# Patient Record
Sex: Female | Born: 1935
Health system: Southern US, Community
[De-identification: ages and names within clinical notes are randomized; demographics above are authoritative.]

## PROBLEM LIST (undated history)

## (undated) DIAGNOSIS — R399 Unspecified symptoms and signs involving the genitourinary system: Secondary | ICD-10-CM

## (undated) DIAGNOSIS — E890 Postprocedural hypothyroidism: Secondary | ICD-10-CM

## (undated) DIAGNOSIS — K219 Gastro-esophageal reflux disease without esophagitis: Secondary | ICD-10-CM

## (undated) DIAGNOSIS — Z859 Personal history of malignant neoplasm, unspecified: Secondary | ICD-10-CM

## (undated) DIAGNOSIS — R112 Nausea with vomiting, unspecified: Secondary | ICD-10-CM

## (undated) DIAGNOSIS — N362 Urethral caruncle: Secondary | ICD-10-CM

## (undated) DIAGNOSIS — Z9889 Other specified postprocedural states: Secondary | ICD-10-CM

## (undated) DIAGNOSIS — N200 Calculus of kidney: Secondary | ICD-10-CM

## (undated) DIAGNOSIS — N329 Bladder disorder, unspecified: Secondary | ICD-10-CM

## (undated) DIAGNOSIS — N201 Calculus of ureter: Secondary | ICD-10-CM

## (undated) DIAGNOSIS — I10 Essential (primary) hypertension: Secondary | ICD-10-CM

## (undated) DIAGNOSIS — K5790 Diverticulosis of intestine, part unspecified, without perforation or abscess without bleeding: Secondary | ICD-10-CM

## (undated) DIAGNOSIS — J302 Other seasonal allergic rhinitis: Secondary | ICD-10-CM

## (undated) DIAGNOSIS — M1990 Unspecified osteoarthritis, unspecified site: Secondary | ICD-10-CM

## (undated) DIAGNOSIS — G43909 Migraine, unspecified, not intractable, without status migrainosus: Secondary | ICD-10-CM

## (undated) DIAGNOSIS — N393 Stress incontinence (female) (male): Secondary | ICD-10-CM

## (undated) DIAGNOSIS — F419 Anxiety disorder, unspecified: Secondary | ICD-10-CM

## (undated) DIAGNOSIS — T8859XA Other complications of anesthesia, initial encounter: Secondary | ICD-10-CM

## (undated) DIAGNOSIS — T4145XA Adverse effect of unspecified anesthetic, initial encounter: Secondary | ICD-10-CM

## (undated) DIAGNOSIS — K449 Diaphragmatic hernia without obstruction or gangrene: Secondary | ICD-10-CM

## (undated) DIAGNOSIS — F32A Depression, unspecified: Secondary | ICD-10-CM

## (undated) DIAGNOSIS — R310 Gross hematuria: Secondary | ICD-10-CM

## (undated) DIAGNOSIS — Z972 Presence of dental prosthetic device (complete) (partial): Secondary | ICD-10-CM

## (undated) DIAGNOSIS — F329 Major depressive disorder, single episode, unspecified: Secondary | ICD-10-CM

## (undated) DIAGNOSIS — Z87442 Personal history of urinary calculi: Secondary | ICD-10-CM

## (undated) HISTORY — DX: Depression, unspecified: F32.A

## (undated) HISTORY — PX: CATARACT EXTRACTION W/ INTRAOCULAR LENS  IMPLANT, BILATERAL: SHX1307

## (undated) HISTORY — DX: Diverticulosis of intestine, part unspecified, without perforation or abscess without bleeding: K57.90

## (undated) HISTORY — DX: Major depressive disorder, single episode, unspecified: F32.9

## (undated) HISTORY — DX: Diaphragmatic hernia without obstruction or gangrene: K44.9

## (undated) HISTORY — PX: EXTRACORPOREAL SHOCK WAVE LITHOTRIPSY: SHX1557

## (undated) HISTORY — PX: ANTERIOR AND POSTERIOR REPAIR: SHX1172

## (undated) HISTORY — DX: Anxiety disorder, unspecified: F41.9

## (undated) HISTORY — DX: Essential (primary) hypertension: I10

## (undated) HISTORY — DX: Migraine, unspecified, not intractable, without status migrainosus: G43.909

---

## 1957-10-18 HISTORY — PX: THYROIDECTOMY: SHX17

## 1957-10-18 HISTORY — PX: ABDOMINAL EXPLORATION SURGERY: SHX538

## 1976-10-18 HISTORY — PX: VAGINAL HYSTERECTOMY: SUR661

## 1999-03-23 ENCOUNTER — Encounter (INDEPENDENT_AMBULATORY_CARE_PROVIDER_SITE_OTHER): Payer: Self-pay | Admitting: *Deleted

## 2000-06-07 ENCOUNTER — Other Ambulatory Visit: Admission: RE | Admit: 2000-06-07 | Discharge: 2000-06-07 | Payer: Self-pay | Admitting: Family Medicine

## 2001-07-18 ENCOUNTER — Other Ambulatory Visit: Admission: RE | Admit: 2001-07-18 | Discharge: 2001-07-18 | Payer: Self-pay | Admitting: Family Medicine

## 2001-08-31 ENCOUNTER — Encounter (INDEPENDENT_AMBULATORY_CARE_PROVIDER_SITE_OTHER): Payer: Self-pay | Admitting: *Deleted

## 2001-08-31 ENCOUNTER — Ambulatory Visit (HOSPITAL_COMMUNITY): Admission: RE | Admit: 2001-08-31 | Discharge: 2001-08-31 | Payer: Self-pay | Admitting: Obstetrics and Gynecology

## 2001-08-31 HISTORY — PX: OTHER SURGICAL HISTORY: SHX169

## 2002-06-29 ENCOUNTER — Ambulatory Visit (HOSPITAL_COMMUNITY): Admission: RE | Admit: 2002-06-29 | Discharge: 2002-06-29 | Payer: Self-pay | Admitting: Family Medicine

## 2002-06-29 ENCOUNTER — Encounter: Payer: Self-pay | Admitting: Family Medicine

## 2002-08-21 ENCOUNTER — Inpatient Hospital Stay (HOSPITAL_COMMUNITY): Admission: RE | Admit: 2002-08-21 | Discharge: 2002-08-24 | Payer: Self-pay | Admitting: Obstetrics and Gynecology

## 2002-08-21 ENCOUNTER — Encounter (INDEPENDENT_AMBULATORY_CARE_PROVIDER_SITE_OTHER): Payer: Self-pay

## 2002-08-21 HISTORY — PX: OTHER SURGICAL HISTORY: SHX169

## 2002-12-20 ENCOUNTER — Encounter: Payer: Self-pay | Admitting: Urology

## 2002-12-20 ENCOUNTER — Ambulatory Visit (HOSPITAL_BASED_OUTPATIENT_CLINIC_OR_DEPARTMENT_OTHER): Admission: RE | Admit: 2002-12-20 | Discharge: 2002-12-20 | Payer: Self-pay | Admitting: Urology

## 2003-04-25 ENCOUNTER — Ambulatory Visit (HOSPITAL_BASED_OUTPATIENT_CLINIC_OR_DEPARTMENT_OTHER): Admission: RE | Admit: 2003-04-25 | Discharge: 2003-04-25 | Payer: Self-pay | Admitting: Urology

## 2003-06-03 ENCOUNTER — Encounter (INDEPENDENT_AMBULATORY_CARE_PROVIDER_SITE_OTHER): Payer: Self-pay | Admitting: Specialist

## 2003-06-03 ENCOUNTER — Inpatient Hospital Stay (HOSPITAL_COMMUNITY): Admission: RE | Admit: 2003-06-03 | Discharge: 2003-06-05 | Payer: Self-pay | Admitting: Obstetrics and Gynecology

## 2003-09-30 ENCOUNTER — Emergency Department (HOSPITAL_COMMUNITY): Admission: EM | Admit: 2003-09-30 | Discharge: 2003-09-30 | Payer: Self-pay | Admitting: Emergency Medicine

## 2003-10-17 ENCOUNTER — Ambulatory Visit (HOSPITAL_COMMUNITY): Admission: RE | Admit: 2003-10-17 | Discharge: 2003-10-17 | Payer: Self-pay | Admitting: Urology

## 2004-02-05 ENCOUNTER — Other Ambulatory Visit: Admission: RE | Admit: 2004-02-05 | Discharge: 2004-02-05 | Payer: Self-pay | Admitting: Obstetrics and Gynecology

## 2004-02-05 ENCOUNTER — Encounter (INDEPENDENT_AMBULATORY_CARE_PROVIDER_SITE_OTHER): Payer: Self-pay | Admitting: *Deleted

## 2004-06-29 ENCOUNTER — Ambulatory Visit (HOSPITAL_COMMUNITY): Admission: RE | Admit: 2004-06-29 | Discharge: 2004-06-29 | Payer: Self-pay | Admitting: Urology

## 2004-09-07 ENCOUNTER — Ambulatory Visit (HOSPITAL_COMMUNITY): Admission: RE | Admit: 2004-09-07 | Discharge: 2004-09-07 | Payer: Self-pay | Admitting: Urology

## 2005-12-09 LAB — CONVERTED CEMR LAB: Pap Smear: NORMAL

## 2006-03-07 ENCOUNTER — Encounter (INDEPENDENT_AMBULATORY_CARE_PROVIDER_SITE_OTHER): Payer: Self-pay | Admitting: *Deleted

## 2006-03-07 ENCOUNTER — Encounter: Admission: RE | Admit: 2006-03-07 | Discharge: 2006-03-07 | Payer: Self-pay | Admitting: Family Medicine

## 2006-03-08 ENCOUNTER — Other Ambulatory Visit: Admission: RE | Admit: 2006-03-08 | Discharge: 2006-03-08 | Payer: Self-pay | Admitting: Obstetrics and Gynecology

## 2006-05-02 ENCOUNTER — Encounter (INDEPENDENT_AMBULATORY_CARE_PROVIDER_SITE_OTHER): Payer: Self-pay | Admitting: *Deleted

## 2006-06-14 ENCOUNTER — Ambulatory Visit (HOSPITAL_BASED_OUTPATIENT_CLINIC_OR_DEPARTMENT_OTHER): Admission: RE | Admit: 2006-06-14 | Discharge: 2006-06-15 | Payer: Self-pay | Admitting: Orthopedic Surgery

## 2006-06-14 HISTORY — PX: SHOULDER ARTHROSCOPY WITH OPEN ROTATOR CUFF REPAIR AND DISTAL CLAVICLE ACROMINECTOMY: SHX5683

## 2006-07-26 ENCOUNTER — Encounter (INDEPENDENT_AMBULATORY_CARE_PROVIDER_SITE_OTHER): Payer: Self-pay | Admitting: *Deleted

## 2006-08-15 ENCOUNTER — Ambulatory Visit: Payer: Self-pay | Admitting: Gastroenterology

## 2006-08-30 ENCOUNTER — Ambulatory Visit: Payer: Self-pay | Admitting: Gastroenterology

## 2006-08-30 LAB — HM COLONOSCOPY: HM COLON: NORMAL

## 2007-04-11 ENCOUNTER — Encounter (INDEPENDENT_AMBULATORY_CARE_PROVIDER_SITE_OTHER): Payer: Self-pay | Admitting: *Deleted

## 2007-05-11 ENCOUNTER — Encounter (INDEPENDENT_AMBULATORY_CARE_PROVIDER_SITE_OTHER): Payer: Self-pay | Admitting: *Deleted

## 2007-11-03 ENCOUNTER — Encounter (INDEPENDENT_AMBULATORY_CARE_PROVIDER_SITE_OTHER): Payer: Self-pay | Admitting: *Deleted

## 2008-02-29 ENCOUNTER — Ambulatory Visit (HOSPITAL_COMMUNITY): Admission: RE | Admit: 2008-02-29 | Discharge: 2008-02-29 | Payer: Self-pay | Admitting: Urology

## 2008-07-08 ENCOUNTER — Encounter (INDEPENDENT_AMBULATORY_CARE_PROVIDER_SITE_OTHER): Payer: Self-pay | Admitting: *Deleted

## 2008-10-07 ENCOUNTER — Encounter (INDEPENDENT_AMBULATORY_CARE_PROVIDER_SITE_OTHER): Payer: Self-pay | Admitting: *Deleted

## 2008-12-11 ENCOUNTER — Ambulatory Visit: Payer: Self-pay | Admitting: *Deleted

## 2008-12-11 DIAGNOSIS — I10 Essential (primary) hypertension: Secondary | ICD-10-CM | POA: Insufficient documentation

## 2008-12-11 DIAGNOSIS — G43909 Migraine, unspecified, not intractable, without status migrainosus: Secondary | ICD-10-CM | POA: Insufficient documentation

## 2008-12-11 DIAGNOSIS — K5909 Other constipation: Secondary | ICD-10-CM

## 2008-12-11 DIAGNOSIS — N200 Calculus of kidney: Secondary | ICD-10-CM

## 2008-12-11 DIAGNOSIS — E039 Hypothyroidism, unspecified: Secondary | ICD-10-CM

## 2008-12-11 DIAGNOSIS — J301 Allergic rhinitis due to pollen: Secondary | ICD-10-CM

## 2008-12-15 DIAGNOSIS — F341 Dysthymic disorder: Secondary | ICD-10-CM | POA: Insufficient documentation

## 2008-12-15 DIAGNOSIS — K219 Gastro-esophageal reflux disease without esophagitis: Secondary | ICD-10-CM | POA: Insufficient documentation

## 2008-12-19 DIAGNOSIS — M7710 Lateral epicondylitis, unspecified elbow: Secondary | ICD-10-CM | POA: Insufficient documentation

## 2009-01-22 ENCOUNTER — Ambulatory Visit: Payer: Self-pay | Admitting: *Deleted

## 2009-03-18 ENCOUNTER — Ambulatory Visit: Payer: Self-pay | Admitting: Diagnostic Radiology

## 2009-03-18 ENCOUNTER — Ambulatory Visit: Payer: Self-pay | Admitting: Family Medicine

## 2009-03-18 ENCOUNTER — Ambulatory Visit (HOSPITAL_BASED_OUTPATIENT_CLINIC_OR_DEPARTMENT_OTHER): Admission: RE | Admit: 2009-03-18 | Discharge: 2009-03-18 | Payer: Self-pay | Admitting: Family Medicine

## 2009-07-14 ENCOUNTER — Encounter: Payer: Self-pay | Admitting: Internal Medicine

## 2009-07-16 ENCOUNTER — Encounter: Payer: Self-pay | Admitting: Internal Medicine

## 2009-07-28 ENCOUNTER — Ambulatory Visit: Payer: Self-pay | Admitting: Internal Medicine

## 2009-07-28 LAB — CONVERTED CEMR LAB
AST: 20 units/L (ref 0–37)
Albumin: 4.8 g/dL (ref 3.5–5.2)
Alkaline Phosphatase: 70 units/L (ref 39–117)
CO2: 27 meq/L (ref 19–32)
Glucose, Bld: 89 mg/dL (ref 70–99)
Hemoglobin: 13.3 g/dL (ref 12.0–15.0)
Lymphocytes Relative: 28 % (ref 12–46)
Monocytes Absolute: 0.6 10*3/uL (ref 0.1–1.0)
Monocytes Relative: 8 % (ref 3–12)
Neutro Abs: 4.8 10*3/uL (ref 1.7–7.7)
Neutrophils Relative %: 60 % (ref 43–77)
Potassium: 4.2 meq/L (ref 3.5–5.3)
RBC: 4.29 M/uL (ref 3.87–5.11)
Sodium: 143 meq/L (ref 135–145)
Total Bilirubin: 0.4 mg/dL (ref 0.3–1.2)
WBC: 7.9 10*3/uL (ref 4.0–10.5)

## 2009-07-29 ENCOUNTER — Encounter: Payer: Self-pay | Admitting: Internal Medicine

## 2009-07-31 ENCOUNTER — Other Ambulatory Visit: Admission: RE | Admit: 2009-07-31 | Discharge: 2009-07-31 | Payer: Self-pay | Admitting: Obstetrics and Gynecology

## 2009-07-31 ENCOUNTER — Encounter: Payer: Self-pay | Admitting: Obstetrics and Gynecology

## 2009-07-31 ENCOUNTER — Ambulatory Visit: Payer: Self-pay | Admitting: Obstetrics and Gynecology

## 2009-08-25 ENCOUNTER — Ambulatory Visit: Payer: Self-pay | Admitting: Internal Medicine

## 2009-08-25 LAB — CONVERTED CEMR LAB
BUN: 23 mg/dL (ref 6–23)
Chloride: 104 meq/L (ref 96–112)
Creatinine, Ser: 0.83 mg/dL (ref 0.40–1.20)
Glucose, Bld: 87 mg/dL (ref 70–99)
Potassium: 4.2 meq/L (ref 3.5–5.3)

## 2009-09-01 ENCOUNTER — Ambulatory Visit: Payer: Self-pay | Admitting: Internal Medicine

## 2009-10-01 ENCOUNTER — Telehealth: Payer: Self-pay | Admitting: Internal Medicine

## 2009-10-03 ENCOUNTER — Telehealth: Payer: Self-pay | Admitting: Internal Medicine

## 2009-11-25 ENCOUNTER — Ambulatory Visit (HOSPITAL_BASED_OUTPATIENT_CLINIC_OR_DEPARTMENT_OTHER): Admission: RE | Admit: 2009-11-25 | Discharge: 2009-11-25 | Payer: Self-pay | Admitting: Urology

## 2009-11-25 HISTORY — PX: OTHER SURGICAL HISTORY: SHX169

## 2009-12-15 ENCOUNTER — Ambulatory Visit: Payer: Self-pay | Admitting: Obstetrics and Gynecology

## 2009-12-22 ENCOUNTER — Ambulatory Visit: Payer: Self-pay | Admitting: Obstetrics and Gynecology

## 2010-01-01 ENCOUNTER — Ambulatory Visit: Payer: Self-pay | Admitting: Internal Medicine

## 2010-01-08 ENCOUNTER — Telehealth: Payer: Self-pay | Admitting: Internal Medicine

## 2010-01-08 ENCOUNTER — Ambulatory Visit: Payer: Self-pay | Admitting: Internal Medicine

## 2010-01-08 DIAGNOSIS — R1310 Dysphagia, unspecified: Secondary | ICD-10-CM | POA: Insufficient documentation

## 2010-01-08 LAB — CONVERTED CEMR LAB
ALT: 21 units/L (ref 0–35)
AST: 23 units/L (ref 0–37)
BUN: 23 mg/dL (ref 6–23)
CO2: 25 meq/L (ref 19–32)
Calcium: 9.5 mg/dL (ref 8.4–10.5)
Cholesterol: 223 mg/dL — ABNORMAL HIGH (ref 0–200)
Creatinine, Ser: 0.81 mg/dL (ref 0.40–1.20)
Glucose, Bld: 110 mg/dL — ABNORMAL HIGH (ref 70–99)
Sodium: 142 meq/L (ref 135–145)
TSH: 0.689 microintl units/mL (ref 0.350–4.500)
Total CHOL/HDL Ratio: 3.1

## 2010-01-09 ENCOUNTER — Encounter: Payer: Self-pay | Admitting: Internal Medicine

## 2010-01-12 ENCOUNTER — Encounter: Admission: RE | Admit: 2010-01-12 | Discharge: 2010-01-12 | Payer: Self-pay | Admitting: Internal Medicine

## 2010-01-12 ENCOUNTER — Telehealth: Payer: Self-pay | Admitting: Internal Medicine

## 2010-07-16 ENCOUNTER — Ambulatory Visit: Payer: Self-pay | Admitting: Internal Medicine

## 2010-07-16 DIAGNOSIS — R7309 Other abnormal glucose: Secondary | ICD-10-CM

## 2010-07-16 LAB — CONVERTED CEMR LAB
Chloride: 103 meq/L (ref 96–112)
Hgb A1c MFr Bld: 5.9 % — ABNORMAL HIGH (ref <5.7)
Potassium: 4.5 meq/L (ref 3.5–5.3)
Sodium: 141 meq/L (ref 135–145)
TSH: 1.098 microintl units/mL (ref 0.350–4.500)
Vit D, 1,25-Dihydroxy: 63 (ref 30–89)

## 2010-07-17 ENCOUNTER — Encounter: Payer: Self-pay | Admitting: Internal Medicine

## 2010-07-28 ENCOUNTER — Telehealth: Payer: Self-pay | Admitting: Internal Medicine

## 2010-07-29 ENCOUNTER — Ambulatory Visit: Payer: Self-pay | Admitting: Internal Medicine

## 2010-08-04 ENCOUNTER — Encounter (INDEPENDENT_AMBULATORY_CARE_PROVIDER_SITE_OTHER): Payer: Self-pay | Admitting: *Deleted

## 2010-08-04 ENCOUNTER — Telehealth: Payer: Self-pay | Admitting: Internal Medicine

## 2010-08-19 ENCOUNTER — Ambulatory Visit: Payer: Self-pay | Admitting: Obstetrics and Gynecology

## 2010-08-19 ENCOUNTER — Other Ambulatory Visit: Admission: RE | Admit: 2010-08-19 | Discharge: 2010-08-19 | Payer: Self-pay | Admitting: Obstetrics and Gynecology

## 2010-08-20 ENCOUNTER — Encounter: Payer: Self-pay | Admitting: Internal Medicine

## 2010-08-20 LAB — HM MAMMOGRAPHY: HM Mammogram: ABNORMAL

## 2010-08-24 ENCOUNTER — Ambulatory Visit: Payer: Self-pay | Admitting: Family

## 2010-08-24 DIAGNOSIS — J019 Acute sinusitis, unspecified: Secondary | ICD-10-CM

## 2010-08-24 LAB — CONVERTED CEMR LAB: Rapid Strep: NEGATIVE

## 2010-08-25 ENCOUNTER — Encounter: Payer: Self-pay | Admitting: Internal Medicine

## 2010-08-25 ENCOUNTER — Ambulatory Visit: Payer: Self-pay | Admitting: Obstetrics and Gynecology

## 2010-09-15 ENCOUNTER — Ambulatory Visit: Payer: Self-pay | Admitting: Gastroenterology

## 2010-09-15 DIAGNOSIS — R195 Other fecal abnormalities: Secondary | ICD-10-CM

## 2010-10-07 ENCOUNTER — Ambulatory Visit: Payer: Self-pay | Admitting: Family

## 2010-10-07 ENCOUNTER — Telehealth: Payer: Self-pay | Admitting: Internal Medicine

## 2010-10-21 ENCOUNTER — Telehealth: Payer: Self-pay | Admitting: Gastroenterology

## 2010-10-23 ENCOUNTER — Encounter: Payer: Self-pay | Admitting: Gastroenterology

## 2010-10-23 ENCOUNTER — Ambulatory Visit: Admit: 2010-10-23 | Payer: Self-pay | Admitting: Gastroenterology

## 2010-10-23 ENCOUNTER — Other Ambulatory Visit: Payer: Self-pay | Admitting: Gastroenterology

## 2010-10-23 ENCOUNTER — Ambulatory Visit
Admission: RE | Admit: 2010-10-23 | Discharge: 2010-10-23 | Payer: Self-pay | Source: Home / Self Care | Attending: Gastroenterology | Admitting: Gastroenterology

## 2010-10-23 ENCOUNTER — Encounter (INDEPENDENT_AMBULATORY_CARE_PROVIDER_SITE_OTHER): Payer: Self-pay

## 2010-10-23 LAB — CBC WITH DIFFERENTIAL/PLATELET
Basophils Absolute: 0 10*3/uL (ref 0.0–0.1)
Basophils Relative: 0.3 % (ref 0.0–3.0)
Eosinophils Absolute: 0.1 10*3/uL (ref 0.0–0.7)
Eosinophils Relative: 1.8 % (ref 0.0–5.0)
HCT: 37.7 % (ref 36.0–46.0)
Hemoglobin: 13.1 g/dL (ref 12.0–15.0)
Lymphocytes Relative: 26.3 % (ref 12.0–46.0)
Lymphs Abs: 1.8 10*3/uL (ref 0.7–4.0)
MCHC: 34.6 g/dL (ref 30.0–36.0)
MCV: 93.3 fl (ref 78.0–100.0)
Monocytes Absolute: 0.5 10*3/uL (ref 0.1–1.0)
Monocytes Relative: 7.4 % (ref 3.0–12.0)
Neutro Abs: 4.5 10*3/uL (ref 1.4–7.7)
Neutrophils Relative %: 64.2 % (ref 43.0–77.0)
Platelets: 272 10*3/uL (ref 150.0–400.0)
RBC: 4.04 Mil/uL (ref 3.87–5.11)
RDW: 12.9 % (ref 11.5–14.6)
WBC: 6.9 10*3/uL (ref 4.5–10.5)

## 2010-10-26 ENCOUNTER — Ambulatory Visit
Admission: RE | Admit: 2010-10-26 | Discharge: 2010-10-26 | Payer: Self-pay | Source: Home / Self Care | Attending: Internal Medicine | Admitting: Internal Medicine

## 2010-10-26 ENCOUNTER — Encounter: Payer: Self-pay | Admitting: Gastroenterology

## 2010-10-26 DIAGNOSIS — R197 Diarrhea, unspecified: Secondary | ICD-10-CM | POA: Insufficient documentation

## 2010-10-27 ENCOUNTER — Encounter: Payer: Self-pay | Admitting: Internal Medicine

## 2010-10-27 LAB — CONVERTED CEMR LAB
BUN: 18 mg/dL (ref 6–23)
Calcium: 10.2 mg/dL (ref 8.4–10.5)
Creatinine, Ser: 0.86 mg/dL (ref 0.40–1.20)
Glucose, Bld: 84 mg/dL (ref 70–99)
Sodium: 142 meq/L (ref 135–145)

## 2010-10-30 ENCOUNTER — Telehealth: Payer: Self-pay | Admitting: Gastroenterology

## 2010-10-30 ENCOUNTER — Telehealth: Payer: Self-pay | Admitting: Internal Medicine

## 2010-11-02 ENCOUNTER — Ambulatory Visit
Admission: RE | Admit: 2010-11-02 | Discharge: 2010-11-02 | Payer: Self-pay | Source: Home / Self Care | Attending: Internal Medicine | Admitting: Internal Medicine

## 2010-11-02 ENCOUNTER — Telehealth: Payer: Self-pay | Admitting: Internal Medicine

## 2010-11-02 ENCOUNTER — Ambulatory Visit (HOSPITAL_BASED_OUTPATIENT_CLINIC_OR_DEPARTMENT_OTHER)
Admission: RE | Admit: 2010-11-02 | Discharge: 2010-11-02 | Payer: Self-pay | Source: Home / Self Care | Attending: Internal Medicine | Admitting: Internal Medicine

## 2010-11-02 DIAGNOSIS — R1032 Left lower quadrant pain: Secondary | ICD-10-CM | POA: Insufficient documentation

## 2010-11-06 ENCOUNTER — Ambulatory Visit: Admit: 2010-11-06 | Payer: Self-pay | Admitting: Gastroenterology

## 2010-11-09 ENCOUNTER — Ambulatory Visit: Admit: 2010-11-09 | Payer: Self-pay | Admitting: Internal Medicine

## 2010-11-19 NOTE — Miscellaneous (Signed)
  Clinical Lists Changes  Medications: Added new medication of DEXILANT 60 MG CPDR (DEXLANSOPRAZOLE) Take one per day - Signed Rx of DEXILANT 60 MG CPDR (DEXLANSOPRAZOLE) Take one per day;  #30 x 0;  Signed;  Entered by: Clide Cliff RN;  Authorized by: Mardella Layman MD [facility];  Method used: Electronically to [facility] [address] [place], [place], [place]  [address], Ph: [phone], Fax: [phone]    Prescriptions: DEXILANT 60 MG CPDR (DEXLANSOPRAZOLE) Take one per day  #30 x 0   Entered by:   Clide Cliff RN   Authorized by:   Mardella Layman MD [facility]   Signed by:   Clide Cliff RN on 10/23/2010   Method used:   Electronically to        [facility] (retail)       [address]       [place], [place]  [address]       Ph: [phone]       Fax: [phone]   RxID:   [id number]

## 2010-11-19 NOTE — Letter (Signed)
   [place] at [facility] [address] [place], [place]  [address]  [place] Phone: [phone]      January 09, 2010   Mallory Hardy [address] [place], [place] [address]  RE:  LAB RESULTS  Dear  [name],  The following is an interpretation of your most recent lab tests.  Please take note of any instructions provided or changes to medications that have resulted from your lab work.  ELECTROLYTES:  Good - no changes needed  KIDNEY FUNCTION TESTS:  Good - no changes needed  LIPID PANEL:  Fair - review at your next visit Triglyceride: 73   Cholesterol: 223   LDL: 135   HDL: 73   Chol/HDL%:  3.1 Ratio  THYROID STUDIES:  Thyroid studies normal TSH: 0.689     Your blood sugar is slightly elevated.  Please avoids sweets and limit your carbohydrate intake to 30 grams per meal.        Sincerely Yours,    Dr. Thomos Lemons

## 2010-11-19 NOTE — Progress Notes (Signed)
Summary: babysitting for child with scabies   Phone Note Call from Patient Call back at Home Phone [phone]   Caller: Patient Call For: yoo  Summary of Call: patient is baby sitting for a child who was diagnosed with scabies.  The pediatrician told her to call her doctor and get started on some meds.  Initial call taken by: Roselle Locus,  October 07, 2010 3:08 PM  Follow-up for Phone Call        patient has scheduled appointment 12/21 @ 4p with Sandford Craze to have concerns addressed Follow-up by: Glendell Docker CMA,  October 07, 2010 3:58 PM

## 2010-11-19 NOTE — Procedures (Signed)
Summary: Upper Endoscopy  Patient: Mallory Hardy Note: All result statuses are Final unless otherwise noted.  Tests: (1) Upper Endoscopy (EGD)   EGD Upper Endoscopy       DONE     [facility]     [address]     [place], [place]  [address]           ENDOSCOPY PROCEDURE REPORT           PATIENT:  Mallory Hardy, Mallory Hardy  MR#:  [id number]     BIRTHDATE:  [birth year], 75 yrs. old  GENDER:  female           ENDOSCOPIST:  Barbette Hair. Arlyce Dice, MD     Referred by:           PROCEDURE DATE:  10/23/2010     PROCEDURE:  EGD, diagnostic 43235     ASA CLASS:  Class II     INDICATIONS:  abdominal pain           MEDICATIONS:   There was residual sedation effect present from     prior procedure., glycopyrrolate (Robinal) 0.2 mg IV, 0.6cc     simethancone 0.6 cc PO     TOPICAL ANESTHETIC:  Exactacain Spray           DESCRIPTION OF PROCEDURE:   After the risks benefits and     alternatives of the procedure were thoroughly explained, informed     consent was obtained.  The LB [id number] endoscope was     introduced through the mouth and advanced to the third portion of     the duodenum, without limitations.  The instrument was slowly     withdrawn as the mucosa was fully examined.     <<PROCEDUREIMAGES>>           A stricture was found at the gastroesophageal junction (see image5     and image6). Early mucosal ring  Otherwise the examination was     normal (see image1, image2, image3, image4, and image7).     Retroflexed views revealed no abnormalities.    The scope was then     withdrawn from the patient and the procedure completed.           COMPLICATIONS:  None           ENDOSCOPIC IMPRESSION:     1) Stricture at the gastroesophageal junction     2) Otherwise normal examination     RECOMMENDATIONS:     1) continue PPI     2) hyomax as needed for abd pain     2) Call office next 2-3 days to schedule an office appointment     for 4 weeks           Propofol sedation for  future procedures           REPEAT EXAM:  No           ______________________________     Barbette Hair. Arlyce Dice, MD           CC:  Thomos Lemons, DO, Carmelina Peal, MD           n.     Rosalie Doctor:   Barbette Hair. Kaplan at 10/23/2010 02:40 PM           Rhodia Albright, [phone]  Note: An exclamation mark (!) indicates a result that was not dispersed into the flowsheet. Document Creation Date: 10/23/2010 2:41 PM _______________________________________________________________________  (1) Order result status: Final Collection  or observation date-time: 10/23/2010 14:23 Requested date-time:  Receipt date-time:  Reported date-time:  Referring Physician:   Ordering Physician: Melvia Heaps [phone]) Specimen Source:  Source: Launa Grill Order Number: [id number] Lab site:

## 2010-11-19 NOTE — Progress Notes (Signed)
  Phone Note Outgoing Call   Summary of Call: call pt - swallowing study shows small hiatal hernia.  moderate amout of reflux.  no mass or obstruction Initial call taken by: D. Thomos Lemons DO,  January 12, 2010 3:05 PM  Follow-up for Phone Call        Called  pt notified as directed  Follow-up by: Darral Dash,  January 13, 2010 7:51 AM

## 2010-11-19 NOTE — Assessment & Plan Note (Signed)
Summary: 6 month follow up/mhf   Vital Signs:  Patient profile:   76 year old female Height:      66.5 inches Weight:      164.50 pounds BMI:     26.25 O2 Sat:      99 % on Room air Temp:     97.5 degrees F oral Pulse rate:   57 / minute Pulse rhythm:   regular Resp:     18 per minute BP sitting:   116 / 80  (left arm) Cuff size:   regular  Vitals Entered By: Glendell Docker CMA (July 16, 2010 8:57 AM)  O2 Flow:  Room air CC: 6 Month Follow up Is Patient Diabetic? No Pain Assessment Patient in pain? no        Primary Care Meshach Perry:  Dondra Spry DO  CC:  6 Month Follow up.  History of Present Illness: 76 y/o female c/o sharp pain behind left ear  onset 6-7 months getting more frequent sharp last few minutes no assoc hearing loss, or ear fullness no URI symptoms usually has sinus problems during the fall  Preventive Screening-Counseling & Management  Alcohol-Tobacco     Smoking Status: never  Allergies (verified): No Known Drug Allergies  Past History:  Past Medical History: Current Problems:  UTI (ICD-599.0) HYPOTHYROIDISM (ICD-244.9)   MIGRAINE HEADACHE (ICD-346.90) - years ago   RENAL CALCULUS (ICD-592.0) ALLERGIC RHINITIS, SEASONAL (ICD-477.0) HEADACHE (ICD-784.0) - sinus type headaches ANXIETY/DEPRESSION GERD chronic constipation    Past Surgical History: thyroidectomy 1959- goiter lower abd. 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   Head:  normocephalic, atraumatic, no  abnormalities observed, and no abnormalities palpated.   no tenderness of left mastoid Eyes:  pupils equal, pupils round, and pupils reactive to light.  no nystagmus Ears:  R ear normal and L ear normal.   Lungs:  normal respiratory effort and normal breath sounds.   Heart:  normal rate, regular rhythm, and no gallop.   Abdomen:  soft, non-tender, and normal bowel sounds.   Extremities:  No lower extremity edema Neurologic:  cranial nerves II-XII intact.   Psych:  normally interactive and good eye contact.     Impression & Recommendations:  Problem # 1:  HYPERGLYCEMIA (ICD-790.29) Pt counseled on diet and exercise.  Orders: T- Hemoglobin A1C ([address]) T-Basic Metabolic Panel [phone])  Labs Reviewed: Creat: 0.81 (01/08/2010)     Problem # 2:  DYSPHAGIA UNSPECIFIED (ICD-787.20) Assessment: Improved Findings:  A double contrast study shows the mucosa of the esophagus to be normal.  A single contrast study shows the 12/2009 swallowing mechanism to be normal. There is some prominence of the cricopharyngeus muscle.  Esophageal peristalsis is normal.  There is a small hiatal hernia present.  Moderate gastroesophageal reflux is noted.  A barium pill was given at the end of the study which passed into the stomach without delay.   IMPRESSION:   1.  Small hiatal hernia with moderate gastroesophageal reflux. 2.  Barium pill passes into the stomach  without delay.  Problem # 3:  HYPERTENSION (ICD-401.9) Assessment: Unchanged  Her updated medication list for this problem includes:    Hydrochlorothiazide 12.5 Mg Caps (Hydrochlorothiazide) ..... One by mouth once daily  BP today: 116/80 Prior BP: 120/80 (01/08/2010)  Labs Reviewed: K+: 3.9 (01/08/2010) Creat: : 0.81 (01/08/2010)   Chol: 223 (01/08/2010)   HDL: 73 (01/08/2010)   LDL: 135 (01/08/2010)   TG: 73 (01/08/2010)  Problem # 4:  HEADACHE (ICD-784.0) pt with intermittent left side headache behind left ear near  mastoid.  I suspect pain is radiating from OA of C spine trial of voltaren gel and gentle neck exercises. If symptoms get worse, consider CT of head Her updated medication list for this problem includes:    Bayer Aspirin Ec Low Dose 81 Mg Tbec (Aspirin) .Marland Kitchen... Take 1 tablet by mouth once a day  Complete Medication List: 1)  Levoxyl 50 Mcg Tabs (Levothyroxine sodium) .... Take 1 tablet by mouth once a day 2)  Multivitamins Caps (Multiple vitamin) .... Take 1 tablet by mouth once a day 3)  Calcium 600/vitamin D 600-400 Mg-unit Tabs (Calcium carbonate-vitamin d) .... Take 1 tablet by mouth two times a day 4)  Bayer Aspirin Ec Low Dose 81 Mg Tbec (Aspirin) .... Take 1 tablet by mouth once a day 5)  Paroxetine Hcl 20 Mg Tabs (Paroxetine hcl) .... 1/2 tablet by mouth once daily 6)  Miralax Powd (Polyethylene glycol 3350) .... Use as directed 7)  Claritin 10 Mg Tabs (Loratadine) .... Take 1 tablet by mouth once a day as needed 8)  Hydrochlorothiazide 12.5 Mg Caps (Hydrochlorothiazide) .... One by mouth once daily 9)  Fluticasone Propionate 50 Mcg/act Susp (Fluticasone propionate) .... 2 sprays each nostril once daily 10)  Zantac 150 Mg Tabs (Ranitidine hcl) .... Take 1 tablet by mouth once a day as needed 11)  Zostavax [address] Unt/0.44ml Solr (Zoster vaccine live) .... Administer vaccine x 1 12)  Voltaren 1 % Gel (Diclofenac sodium) .... Apply three times a day x 1 week  Other Orders: Influenza Vaccine MCR ([address]454) Flu Vaccine 23yrs + MEDICARE PATIENTS (U9811) T-TSH ([address]) T- * Misc. Laboratory test [phone])  Patient Instructions: 1)  Please schedule a follow-up appointment in 6 months. 2)  BMP prior to visit, ICD-9: 401.9 3)  TSH prior to visit, ICD-9: 244.9 4)  Please return for lab work one (1) week before your next appointment.  5)  Call our office if your left sided headache gets worse.   Immunizations Administered:  Influenza Vaccine # 1:    Vaccine Type: Fluvax MCR    Site:  left deltoid    Mfr: GlaxoSmithKline    Dose: 0.5 ml    Route: IM    Given by: Glendell Docker CMA    Exp. Date: 04/17/2011    Lot #: [id number]    VIS given: 05/12/10 version given July 16, 2010.  Flu Vaccine Consent Questions:    Do you have a history of severe allergic reactions to this vaccine? no    Any prior history of allergic reactions to egg and/or gelatin? no    Do you have a sensitivity to the preservative Thimersol? no    Do you have a past history of Guillan-Barre Syndrome? no    Do you currently have an acute febrile illness? no    Have you ever had a severe reaction to latex? no    Vaccine information given and explained to patient? yes    Are you currently pregnant?  no    Orders Added: 1)  Influenza Vaccine MCR [00025] 2)  Flu Vaccine 3yrs + MEDICARE PATIENTS [Q2039] 3)  T- Hemoglobin A1C [83036-23375] 4)  T-Basic Metabolic Panel [80048-22910] 5)  T-TSH [81191-47829] 6)  T- * Misc. Laboratory test [99999] 7)  Est. Patient Level III [[address]]   Current Allergies (reviewed today): No known allergies

## 2010-11-19 NOTE — Miscellaneous (Signed)
Summary: rapid strep test  Clinical Lists Changes  Orders: Added new Service order of Rapid Strep [phone]) - Signed Observations: Added new observation of RAPID STREP: negative (08/24/2010 9:39)     Laboratory Results   Date/Time Reported: Mervin Kung CMA [facility])  August 24, 2010 9:40 AM   Other Tests  Rapid Strep: negative  Kit Test Internal QC: Positive   (Normal Range: Negative)

## 2010-11-19 NOTE — Progress Notes (Signed)
Summary: lab results and med not working   Phone Note Call from Patient Call back at [employer] [phone]   Caller: Patient Call For: Mallory Hardy  Summary of Call: patient called asking for her lab results and to advise Dr Artist Pais that the [facility] is not helping.   Initial call taken by: Roselle Locus,  October 30, 2010 8:52 AM  Follow-up for Phone Call        kidney function and electrolytes are normal C Diff stool study is negative stool lactoferrin suggests infectious cause  plz advise pt to increase welchol dose 2 tabs three times a day with meals Follow-up by: D. Thomos Lemons DO,  October 30, 2010 12:42 PM  Additional Follow-up for Phone Call Additional follow up Details #1::        call returned to patient, she has been advised per Dr Artist Pais instructions. She states that her stools has been very soft and black, she states her meals are going right through her. Patient was offered appointment for Monday, she states that she will wait to see how she does over the weekend and will call Monday Morning for appointment. She was advised to seek care in the ER  if her symptoms worsen, or she has sever fatigue. Patient verbalized understanding and agrees. Otherwise she is to follow up on Monday Additional Follow-up by: Glendell Docker CMA,  October 30, 2010 2:33 PM    New/Updated Medications: WELCHOL 625 MG TABS (COLESEVELAM HCL) two tabs by mouth three times a day Prescriptions: WELCHOL 625 MG TABS (COLESEVELAM HCL) two tabs by mouth three times a day  #180 x 0   Entered and Authorized by:   D. Thomos Lemons DO   Signed by:   D. Thomos Lemons DO on 10/30/2010   Method used:   Electronically to        [facility] (retail)       [address]       [place], [place]  [address]       Ph: [phone]       Fax: [phone]   RxID:   [id number]

## 2010-11-19 NOTE — Assessment & Plan Note (Signed)
Summary: LIGHT HEADED/OFF BALANCE/HEA   Vital Signs:  Patient profile:   76 year old female Height:      66.5 inches Weight:      158.25 pounds BMI:     25.25 O2 Sat:      97 % on Room air Temp:     98.4 degrees F oral Pulse rate:   67 / minute Pulse rhythm:   regular Resp:     18 per minute BP sitting:   140 / 80  (right arm) Cuff size:   regular  Vitals Entered By: Glendell Docker CMA (January 01, 2010 1:18 PM)  O2 Flow:  Room air CC: Rm 2- Off Balance Comments had head cold last week, with hoarseness, head congestion,feels better this week, but seems off balance   Primary Care Provider:  Dondra Spry DO  CC:  Rm 2- Off Balance.  History of Present Illness: 76 y/o white female c/o intermittent dizziness symptoms worse since URI last week sinues still feel congestion no fever or chills dizziness worse with changes in head position no improvement with brandt daroff exercises  Allergies (verified): No Known Drug Allergies  Past History:  Past Medical History: Current Problems:  UTI (ICD-599.0) HYPOTHYROIDISM (ICD-244.9) MIGRAINE HEADACHE (ICD-346.90) - years ago   RENAL CALCULUS (ICD-592.0) ALLERGIC RHINITIS, SEASONAL (ICD-477.0) HEADACHE (ICD-784.0) - sinus type headaches ANXIETY/DEPRESSION GERD chronic constipation    Past Surgical History: thyroidectomy 1959- goiter lower abd. 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   Ears:  rt TM retractedL ear normal.   Mouth:  Oral mucosa and oropharynx  without lesions or exudates.   Neck:  supple and no masses.   Lungs:  normal respiratory effort and normal breath sounds.   Heart:  normal rate, regular rhythm, and no gallop.     Impression & Recommendations:  Problem # 1:  OTITIS MEDIA, SEROUS (ICD-381.4) 76 y/o with recent URI has dizziness.  Rt TM with air fluid level.  Take ceftin and use nasal steroids.  Patient advised to call office if symptoms persist or worsen.  Complete Medication List: 1)  Levoxyl 50 Mcg Tabs (Levothyroxine sodium) .... Take 1 tablet by mouth once a day 2)  Multivitamins Caps (Multiple vitamin) .... Take 1 tablet by mouth once a day 3)  Calcium 600/vitamin D 600-400 Mg-unit Tabs (Calcium carbonate-vitamin d) .... Take 1 tablet by mouth two times a day 4)  Bayer Aspirin Ec Low Dose 81 Mg Tbec (Aspirin) .... Take 1 tablet by mouth once a day 5)  Paroxetine Hcl 20 Mg Tabs (Paroxetine hcl) .... 1/2 tablet by mouth once daily 6)  Miralax Powd (Polyethylene glycol 3350) .... Use as directed 7)  Claritin 10 Mg Tabs (Loratadine) .... Take 1 tablet by mouth once a day as needed 8)  Hydrochlorothiazide 12.5 Mg Caps (Hydrochlorothiazide) .... One by mouth once daily 9)  Cefuroxime Axetil 500 Mg Tabs (Cefuroxime axetil) .... One by mouth two times a day 10)  Fluticasone Propionate 50 Mcg/act Susp (Fluticasone propionate) .... 2 sprays each nostril once daily  Patient Instructions: 1)  Call our office if your symptoms do not  improve or gets worse. Prescriptions: FLUTICASONE PROPIONATE 50 MCG/ACT SUSP (FLUTICASONE PROPIONATE) 2 sprays each nostril once daily  #1 x 3   Entered and Authorized by:   D. Thomos Lemons DO   Signed by:   D. Thomos Lemons DO on 01/01/2010   Method used:   Electronically to        [facility] (retail)       [address]       [place], [place]  [address]       Ph: [phone]       Fax: [phone]   RxID:   [id number] CEFUROXIME AXETIL 500 MG TABS (CEFUROXIME  AXETIL) one by mouth two times a day  #20 x 0   Entered and Authorized by:   D. Thomos Lemons DO   Signed by:   D. Thomos Lemons DO on 01/01/2010   Method used:   Electronically to        [facility] (retail)       [address]       [place], [place]  [address]       Ph: [phone]       Fax: [phone]   RxID:   [id number]   Current Allergies (reviewed today): No known allergies

## 2010-11-19 NOTE — Procedures (Signed)
Summary: Colonoscopy   Colonoscopy  Procedure date:  08/30/2006  Findings:      Results: Diverticulosis.       Location:  [facility].    Comments:      Repeat colonoscopy in 10 years.    Colonoscopy  Procedure date:  08/30/2006  Findings:      Results: Diverticulosis.       Location:  [facility].    Comments:      Repeat colonoscopy in 10 years.   Patient Name: Mallory Hardy, Mallory Hardy. MRN:  Procedure Procedures: Colonoscopy CPT: [phone].  Personnel: Endoscopist: Barbette Hair. Arlyce Dice, MD.  Patient Consent: Procedure, Alternatives, Risks and Benefits discussed, consent obtained, from patient.  Indications  Average Risk Screening Routine.  History  Current Medications: Patient is not currently taking Coumadin.  Pre-Exam Physical: Performed Aug 30, 2006. Cardio-pulmonary exam, HEENT exam , Abdominal exam, Mental status exam WNL.  Comments: Patient history reviewed/updated, physical performed prior to initiation of sedation?yes Exam Exam: Extent of exam reached: Ileum, extent intended: Cecum.  The cecum was identified by appendiceal orifice and IC valve. Time to Cecum: 00:08: 24. Time for Withdrawl: 00:05:07. Colon retroflexion performed. ASA Classification: II. Tolerance: good.  Monitoring: Pulse and BP monitoring, Oximetry used. Supplemental O2 given. at 2 Liters.  Colon Prep Used Miralax for colon prep. Prep results: good.  Sedation Meds: Patient assessed and found to be appropriate for moderate (conscious) sedation. Sedation was managed by the Endoscopist. Fentanyl 75 mcg. given IV. Versed 10 mg. given IV.  Findings - NORMAL EXAM: Cecum to Sigmoid Colon.  NORMAL EXAM: Cecum.  - DIVERTICULOSIS: Sigmoid Colon. ICD9: Diverticulosis: 562.10. Comments: Moderate diverticular changes.  NORMAL EXAM: Rectum.   Assessment Abnormal examination, see findings above.  Diagnoses: 562.10: Diverticulosis.   Events  Unplanned  Interventions: No intervention was required.  Unplanned Events: There were no complications. Plans  Post Exam Instructions: Post sedation instructions given.  Patient Education: Patient given standard instructions for: Diverticulosis.  Disposition: After procedure patient sent to recovery. After recovery patient sent home.  Scheduling/Referral: Colonoscopy, to Barbette Hair. Arlyce Dice, MD, around Aug 30, 2016.    This report was created from the original endoscopy report, which was reviewed and signed by the above listed endoscopist.

## 2010-11-19 NOTE — Miscellaneous (Signed)
  Clinical Lists Changes  Orders: Added new Test order of TLB-CBC Platelet - w/Differential (85025-CBCD) - Signed 

## 2010-11-19 NOTE — Procedures (Signed)
Summary: Colonoscopy  Patient: Mallory Hardy Note: All result statuses are Final unless otherwise noted.  Tests: (1) Colonoscopy (COL)   COL Colonoscopy           DONE     [facility]     [address]     [place], [place]  [address]           COLONOSCOPY PROCEDURE REPORT           PATIENT:  Mallory Hardy, Mallory Hardy  MR#:  [id number]     BIRTHDATE:  Sep 30, 1935, 75 yrs. old  GENDER:  female           ENDOSCOPIST:  Barbette Hair. Arlyce Dice, MD     Referred by:  Thomos Lemons, DO     Edyth Gunnels, M.D.           PROCEDURE DATE:  10/23/2010     PROCEDURE:     ASA CLASS:  Class II     INDICATIONS:  1) Abdominal pain  2) heme positive stool           MEDICATIONS:   Fentanyl 100 mcg IV, Versed 10 mg IV, Benadryl 12.5     mg IV           DESCRIPTION OF PROCEDURE:   After the risks benefits and     alternatives of the procedure were thoroughly explained, informed     consent was obtained.  Digital rectal exam was performed and     revealed no abnormalities.   The LB 180AL [id number] endoscope was     introduced through the anus and advanced to the cecum, which was     identified by both the appendix and ileocecal valve, without     limitations.  The quality of the prep was good, using MoviPrep.     The instrument was then slowly withdrawn as the colon was fully     examined.     <<PROCEDUREIMAGES>>           FINDINGS:  Internal hemorrhoids were found (see image7).  This was     otherwise a normal examination of the colon (see image1, image4,     image5, and image6).   Retroflexed views in the rectum revealed no     abnormalities.    The time to cecum =  5.0  minutes. The scope was     then withdrawn (time =  6.0  min) from the patient and the     procedure completed.           COMPLICATIONS:  None           ENDOSCOPIC IMPRESSION:     1) Internal hemorrhoids     2) Otherwise normal examination     RECOMMENDATIONS:     1) followup hemeoccults 1-2 weeks     2) Upper endoscopy     3)  CBC           REPEAT EXAM:  No           ______________________________     Barbette Hair. Arlyce Dice, MD           CC:           n.     eSIGNED:   Barbette Hair. Kaplan at 10/23/2010 02:30 PM           Rhodia Albright, [id number]  Note: An exclamation mark (!) indicates a result that was not dispersed into the flowsheet. Document Creation Date: 10/23/2010  2:31 PM _______________________________________________________________________  (1) Order result status: Final Collection or observation date-time: 10/23/2010 14:18 Requested date-time:  Receipt date-time:  Reported date-time:  Referring Physician:   Ordering Physician: Melvia Heaps [phone]) Specimen Source:  Source: Launa Grill Order Number: [id number] Lab site:

## 2010-11-19 NOTE — Assessment & Plan Note (Signed)
Summary: pain in diaphragm/dt   Vital Signs:  Patient profile:   76 year old female Height:      66.5 inches Weight:      165.50 pounds BMI:     26.41 O2 Sat:      99 % on Room air Temp:     98.3 degrees F oral Pulse rate:   55 / minute Pulse rhythm:   regular Resp:     18 per minute BP sitting:   122 / 70  (left arm) Cuff size:   regular  Vitals Entered By: Glendell Docker CMA (July 29, 2010 10:07 AM)  O2 Flow:  Room air CC: Esophagus discomfort Is Patient Diabetic? No Pain Assessment Patient in pain? no        Primary Care Provider:  Dondra Spry DO  CC:  Esophagus discomfort.  History of Present Illness: 76 y/o white female c/o gerd symptoms worse over last 1 months deep burning sensation ,  lower chest and epigastric area no NSAID use only taking zantac  occ coke sierra mist    Preventive Screening-Counseling & Management  Alcohol-Tobacco     Smoking Status: never  Allergies (verified): No Known Drug Allergies  Past History:  Past Medical History: Current Problems:  UTI (ICD-599.0) HYPOTHYROIDISM (ICD-244.9)   MIGRAINE HEADACHE (ICD-346.90) - years ago   RENAL CALCULUS (ICD-592.0)  ALLERGIC RHINITIS, SEASONAL (ICD-477.0) HEADACHE (ICD-784.0) - sinus type headaches ANXIETY/DEPRESSION GERD chronic constipation    Past Surgical History: thyroidectomy 1959- goiter lower abd. 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   Mouth:  pharynx pink and moist.   Neck:  No  deformities, masses, or tenderness noted.no carotid bruits.   Lungs:  normal respiratory effort and normal breath sounds.   Heart:  normal rate, regular rhythm, and no gallop.   Abdomen:  soft, non-tender, and normal bowel sounds.     Impression & Recommendations:  Problem # 1:  GERD (ICD-530.81) switch to PPI.  refer to GI for possible EGD.  rule out barretts.  rule out H. Pylori  The following medications were removed from the medication list:    Zantac 150 Mg Tabs (Ranitidine hcl) .Marland Kitchen... Take 1 tablet by mouth two times a day Her updated medication list for this problem includes:    Omeprazole-sodium Bicarbonate 40-1100 Mg Caps (Omeprazole-sodium bicarbonate) ..... One by mouth once daily  Orders: Gastroenterology Referral (GI)  Complete Medication List: 1)  Levoxyl 50 Mcg Tabs (Levothyroxine sodium) .... Take 1 tablet by mouth once a day 2)  Multivitamins Caps (Multiple vitamin) .... Take 1 tablet by mouth once a day 3)  Calcium 600/vitamin D 600-400 Mg-unit Tabs (Calcium carbonate-vitamin d) .... Take 1 tablet by mouth two times a day 4)  Bayer Aspirin Ec Low Dose 81 Mg Tbec (Aspirin) .... Take 1 tablet by mouth once a day 5)  Paroxetine Hcl 20 Mg Tabs (Paroxetine hcl) .... 1/2 tablet by mouth once daily 6)  Miralax Powd (Polyethylene glycol 3350) .... Use as directed  7)  Claritin 10 Mg Tabs (Loratadine) .... Take 1 tablet by mouth once a day as needed 8)  Hydrochlorothiazide 12.5 Mg Caps (Hydrochlorothiazide) .... One by mouth once daily 9)  Zostavax 81191 Unt/0.39ml Solr (Zoster vaccine live) .... Administer vaccine x 1 10)  Voltaren 1 % Gel (Diclofenac sodium) .... Apply three times a day x 1 week 11)  Omeprazole-sodium Bicarbonate 40-1100 Mg Caps (Omeprazole-sodium bicarbonate) .... One by mouth once daily  Patient Instructions: 1)  Call our office if your symptoms do not  improve or gets worse. Prescriptions: OMEPRAZOLE-SODIUM BICARBONATE 40-1100 MG CAPS (OMEPRAZOLE-SODIUM  BICARBONATE) one by mouth once daily  #30 x 3   Entered and Authorized by:   D. Thomos Lemons DO   Signed by:   D. Thomos Lemons DO on 07/29/2010   Method used:   Electronically to        [facility] (retail)       [address]       [place], [place]  [address]       Ph: [phone]       Fax: [phone]   RxID:   [id number]   Current Allergies (reviewed today): No known allergies

## 2010-11-19 NOTE — Progress Notes (Signed)
Summary: CT Results  Phone Note Outgoing Call   Summary of Call: call pt - CT of abd and pelvis negative for acute process.   incidental findings - fatty infiltration of liver, small liver cyst, small umbilical hernia Initial call taken by: D. Thomos Lemons DO,  November 02, 2010 1:49 PM  Follow-up for Phone Call        call placed to patient at [phone], no answer. A voice message was left for patient to return call. Follow-up by: Glendell Docker CMA,  November 02, 2010 3:03 PM  Additional Follow-up for Phone Call Additional follow up Details #1::        Pt notified of results. Pt wants to verify that you told her it would be ok to take Metamucil while she is having diarrhea. Also wants to know if she needs to modify her diet due to  the finding of fatty liver? Nicki Guadalajara Fergerson CMA Duncan Dull)  November 02, 2010 3:45 PM     Additional Follow-up for Phone Call Additional follow up Details #2::    Ok to start metamucil Thursday or Friday.  re:  fatty liver. avoid fried foods and saturated fats    also avoid sweets/refined sugars (especially high fructose corn syrup) Follow-up by: D. Thomos Lemons DO,  November 02, 2010 5:24 PM  Additional Follow-up for Phone Call Additional follow up Details #3:: Details for Additional Follow-up Action Taken: Pt advised per Dr Olegario Messier recommendation and voices understanding. Pt still having diarrhea. States she was told to f/u with Dr Arlyce Dice on Friday if she is still having diarrhea and will let us know how she is doing. Nicki Guadalajara Fergerson CMA (AAMA)  November 03, 2010 10:00 AM

## 2010-11-19 NOTE — Progress Notes (Signed)
Summary: speak to nurse  Phone Note Call from Patient Call back at Home Phone [phone]   Caller: Patient Call For: Dr Arlyce Dice Reason for Call: Talk to Nurse Summary of Call: Patient wants to speak to you regarding her change in bowels. Initial call taken by: Tawni Levy,  October 30, 2010 3:05 PM  Follow-up for Phone Call        Patient being followed by Dr Artist Pais. Patient has had diarrhea since Christmas and saw Dr Artist Pais on 10/26/10. Stool samples taken and she was placed on Welchol, Cipro and Flagyl. Cultures show CDiff neg. but Lactoferrin Positive. Patient reports her stools have changed color from brown on 10/28/10 to black and tar like consistancy. Patient is afraid she has Anthrax or something horrible. Patient has no appetite and ate cheese and crackers today. Advised patient of BRAT diet. Any suggestions Dr Arlyce Dice and what is Lactoferrin? Thanks, Graciella Freer RN  October 30, 2010 4:35 PM   Dr Arlyce Dice, patient f/u with Dr Artist Pais today and had a CT- you may be able to sign off of this call. Graciella Freer RN  November 02, 2010 4:02 PM    Additional Follow-up for Phone Call Additional follow up Details #1::        No acute abnormalities by CT.  Needs stool hemeoccults.  F/U with Dr. Artist Pais unless sxs don't continue to improve Additional Follow-up by: Louis Meckel MD,  November 02, 2010 4:07 PM    Additional Follow-up for Phone Call Additional follow up Details #2::    Dr Arlyce Dice, diarrhea remains black and watery, but stools have been heme negative today and x2 last week. Dr Artist Pais instructed her to eat cooked veggies only, take Metamucil to perhaps form the stool, stay away from milk products and stop the Probiotic. If you have any suggestions, please write , otherwise you may close this out. Thanks, Graciella Freer RN  November 02, 2010 4:35 PM

## 2010-11-19 NOTE — Letter (Signed)
Summary: Appt Reminder 2  [facility]  [address] [place], [place] [address]   Phone: [phone]  Fax: [phone]        October 26, 2010 MRN: [id number]    Mallory Hardy [address] [place], [place]  [address]    Dear Ms. PIERRELOUIS,   You have a return appointment with Dr. Hennie Duos on 11/25/10 at 10am.  Please remember to bring a complete list of the medicines you are taking, your insurance card and your co-pay.  If you have to cancel or reschedule this appointment, please call before 5:00 pm the evening before to avoid a cancellation fee.  If you have any questions or concerns, please call [phone].    Sincerely,    Selinda Michaels RN  Appended Document: Appt Reminder 2 Letter is mailed to the patient's home address    Appended Document: Appt Reminder 2 Patient aware of appointment date and time.

## 2010-11-19 NOTE — Assessment & Plan Note (Signed)
Summary: GERD & BLOOD IN STOOLS-WANTS ECL/YF   History of Present Illness Visit Type: Initial Consult Primary GI MD: Melvia Heaps MD [facility] Primary Provider: Dondra Spry DO Requesting Provider: Dondra Spry DO Chief Complaint: Pain in Upper abdomen and chest pain, also blood in stool History of Present Illness:   Mrs. Westby is a pleasant 76 year old white female referred at the request of Dr. Artist Pais for evaluation of abdominal pain and reflux. Over the  last 2 years she has been complaining of frequent postprandial upper abdominal discomfort. If she lies down immediately after eating she will have regurgitation of gastric contents.  She denies dysphagia.  She has been taking  over-the-counter acid blockers without much relief.  She currently takes Pepcid AC and  baking powder.  The patient tested Hemoccult positive on routine testing at her gynecologist's office.  She denies change in bowel habits, melena or hematochezia.  Colonoscopy in 2007 was pertinent for diverticulosis.   GI Review of Systems    Reports abdominal pain, acid reflux, belching, bloating, chest pain, and  nausea.      Denies dysphagia with liquids, dysphagia with solids, heartburn, loss of appetite, vomiting, vomiting blood, weight loss, and  weight gain.      Reports constipation and  diarrhea.     Denies anal fissure, black tarry stools, change in bowel habit, diverticulosis, fecal incontinence, heme positive stool, hemorrhoids, irritable bowel syndrome, jaundice, light color stool, liver problems, rectal bleeding, and  rectal pain.    Current Medications (verified): 1)  Levoxyl 50 Mcg Tabs (Levothyroxine Sodium) .... Take 1 Tablet By Mouth Once A Day 2)  Multivitamins  Caps (Multiple Vitamin) .... Take 1 Tablet By Mouth Once A Day 3)  Calcium 600/vitamin D 600-400 Mg-Unit Tabs (Calcium Carbonate-Vitamin D) .... Take 1 Tablet By Mouth Two Times A Day 4)  Bayer Aspirin Ec Low Dose 81 Mg Tbec (Aspirin) .... Take 1 Tablet  By Mouth Once A Day 5)  Paroxetine Hcl 20 Mg Tabs (Paroxetine Hcl) .... 1/2 Tablet By Mouth Once Daily 6)  Miralax  Powd (Polyethylene Glycol 3350) .... Use As Directed 7)  Claritin 10 Mg Tabs (Loratadine) .... Take 1 Tablet By Mouth Once A Day As Needed 8)  Hydrochlorothiazide 12.5 Mg Caps (Hydrochlorothiazide) .... One By Mouth Once Daily 9)  Voltaren 1 % Gel (Diclofenac Sodium) .... Apply Three Times A Day X 1 Week 10)  Dexilant 60 Mg Cpdr (Dexlansoprazole) .Marland Kitchen.. 1 By Mouth Two Times A Day 30 Min Before Breakfast and 30 Minutes Before Dinner 11)  Phillips Colon Health  Caps (Probiotic Product) .Marland Kitchen.. 1 By Mouth Once Daily  Allergies (verified): No Known Drug Allergies  Past History:  Past Medical History: Reviewed history from 09/04/2010 and no changes required. Current Problems:  UTI (ICD-599.0) HYPOTHYROIDISM (ICD-244.9)   MIGRAINE HEADACHE (ICD-346.90) - years ago   RENAL CALCULUS (ICD-592.0)  ALLERGIC RHINITIS, SEASONAL (ICD-477.0) HEADACHE (ICD-784.0) - sinus type headaches ANXIETY/DEPRESSION GERD chronic constipation   Diverticulosis  Past Surgical History: thyroidectomy 1959- goiter lower abd. exploratory 1959   totator cuff repair 2007 bladder tacked 2004    ovaries removed 2003 Hysterectomy  1978  Family History: Family History High cholesterol Family History of  stroke Family History of CAD Female 1st degree relative <60 Family History Hypertension  No FH of Colon Cancer:  Social History: Reviewed history from 07/29/2010 and no changes required. 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  The patient denies anemia, anxiety-new, arthritis/joint pain, back pain, blood in urine, breast changes/lumps, change in vision, confusion,  cough, coughing up blood, depression-new, fainting, fatigue, fever, hearing problems, heart murmur, heart rhythm changes, itching, menstrual pain, muscle pains/cramps, night sweats, nosebleeds, pregnancy symptoms, shortness of breath, skin rash, sleeping problems, swelling of feet/legs, swollen lymph glands, thirst - excessive , urination - excessive , urination changes/pain, vision changes, and voice change.         All other systems were reviewed and were negative   Vital Signs:  Patient profile:   76 year old female Height:      66.5 inches Weight:      165 pounds BMI:     26.33 BSA:     1.85 Pulse rate:   78 / minute BP sitting:   120 / 70  (left arm)  Vitals Entered By: Merri Ray CMA Duncan Dull) (September 15, 2010 2:07 PM)  Physical Exam  Additional Exam:  On physical exam she is well-developed well-nourished female  skin: anicteric HEENT: normocephalic; PEERLA; no nasal or pharyngeal abnormalities neck: supple nodes: no cervical lymphadenopathy chest: clear to ausculatation and percussion heart: no murmurs, gallops, or rubs abd: soft, nontender; BS normoactive; no abdominal masses, tenderness, organomegaly rectal: deferred ext: no cynanosis, clubbing, edema; there are moderate venous varicosities skeletal: no deformities neuro: oriented x 3; no focal abnormalities    Impression & Recommendations:  Problem # 1:  GERD (ICD-530.81)  The patient continues to have GERD despite taking various acid blockers.  She complains of upper abdominal pain which could be related to this.  H. pylori infection and active peptic disease are other considerations.  Recommendations #1 empiric trial of DEXILANT 60 mg before breakfast and dinner #2 upper endoscopy  Orders: Colon/Endo (Colon/Endo)  Problem # 2:  FECAL OCCULT BLOOD (ICD-792.1)  Plan colonoscopy to rule out colonic bleeding sources including polyps, AVMs, neoplasm and hemorrhoids  Risks, alternatives, and complications  of the procedure, including bleeding, perforation, and possible need for surgery, were explained to the patient.  Patient's questions were answered.  Orders: Colon/Endo (Colon/Endo)  Patient Instructions: 1)  Copy sent to : D. Thomos Lemons DO, Edyth Gunnels MD 2)  Your Endo/Colon is scheduled on 10/23/2010 at 1:30pm in [facility] 3)  Colonoscopy and Flexible Sigmoidoscopy brochure given.  4)  Conscious Sedation brochure given.  5)  Upper Endoscopy brochure given.  6)  We gave you Dexilant samples today 7)  The medication list was reviewed and reconciled.  All changed / newly prescribed medications were explained.  A complete medication list was provided to the patient / caregiver. Prescriptions: MOVIPREP 100 GM  SOLR (PEG-KCL-NACL-NASULF-NA ASC-C) As per prep instructions.  #1 x 0   Entered by:   Merri Ray CMA (AAMA)   Authorized by:   Louis Meckel MD   Signed by:   Merri Ray CMA (AAMA) on 09/15/2010   Method used:   Electronically to        [facility] (retail)       [address]       [place], [place]  [address]       Ph: [phone]       Fax: [phone]   RxID:   [id number]

## 2010-11-19 NOTE — Progress Notes (Signed)
Summary: GI Referral Status  Phone Note Call from Patient Call back at Home Phone [phone]   Caller: Patient Call For: D. Thomos Lemons DO Summary of Call: patient called and left voice message stating she was seen in the office last week onthe 10th and relayed to Dr Artist Pais that she was having problems with her esophagus. She states she is still having problems and has not heard anyhting regarding referral. She is calling to check on status Initial call taken by: Glendell Docker CMA,  August 04, 2010 2:43 PM  Follow-up for Phone Call        plz check status of referral Follow-up by: D. Thomos Lemons DO,  August 04, 2010 3:03 PM  Additional Follow-up for Phone Call Additional follow up Details #1::        Pt scheduled to see Dr Arlyce Dice  November  29 th  L/M for pt to return calll Additional Follow-up by: Darral Dash,  August 04, 2010 3:41 PM

## 2010-11-19 NOTE — Letter (Signed)
Summary: CMA Hemoccult Letter  [facility]  [address] [place], [place] [address]   Phone: [phone]  Fax: [phone]         October 26, 2010 MRN: [id number]    Mallory Hardy [address] [place], [place]  [address]    Dear [name],     At your colonoscopy visit, Dr. Arlyce Dice requested that you complete  hemoccult cards. Please follow the instructions on the inside cover and return them as soon as possible.If you have misplaced the hemoccult cards, please call me at [phone] and I will mail you new cards. Your health is very important to [place].These tests will help ensure that Dr. Arlyce Dice has all the information at his disposal to make a complete diagnosis for you.  Thank you for your prompt attention to this matter.   Sincerely,    Selinda Michaels RN

## 2010-11-19 NOTE — Letter (Signed)
Summary: [facility] Instructions  [facility]  [address] [place], [place] [address]   Phone: [phone]  Fax: [phone]       Mallory Hardy    [birth year]    MRN: [id number]        Procedure Day /Date:FRIDAY 10/23/2010     Arrival Time:12:30PM     Procedure Time:1:30PM     Location of Procedure:                    X   [facility] (4th Floor)   PREPARATION FOR COLONOSCOPY WITH MOVIPREP   Starting 5 days prior to your procedure 10/18/2010 do not eat nuts, seeds, popcorn, corn, beans, peas,  salads, or any raw vegetables.  Do not take any fiber supplements (e.g. Metamucil, Citrucel, and Benefiber).  THE DAY BEFORE YOUR PROCEDURE         DATE: 10/22/2010  DAY: THURSDAY  1.  Drink clear liquids the entire day-NO SOLID FOOD  2.  Do not drink anything colored red or purple.  Avoid juices with pulp.  No orange juice.  3.  Drink at least 64 oz. (8 glasses) of fluid/clear liquids during the day to prevent dehydration and help the prep work efficiently.  CLEAR LIQUIDS INCLUDE: Water Jello Ice Popsicles Tea (sugar ok, no milk/cream) Powdered fruit flavored drinks Coffee (sugar ok, no milk/cream) Gatorade Juice: apple, white grape, white cranberry  Lemonade Clear bullion, consomm, broth Carbonated beverages (any kind) Strained chicken noodle soup Hard Candy                             4.  In the morning, mix first dose of MoviPrep solution:    Empty 1 Pouch A and 1 Pouch B into the disposable container    Add lukewarm drinking water to the top line of the container. Mix to dissolve    Refrigerate (mixed solution should be used within 24 hrs)  5.  Begin drinking the prep at 5:00 p.m. The MoviPrep container is divided by 4 marks.   Every 15 minutes drink the solution down to the next mark (approximately 8 oz) until the full liter is complete.   6.  Follow completed prep with 16 oz of clear liquid of your choice (Nothing red or purple).   Continue to drink clear liquids until bedtime.  7.  Before going to bed, mix second dose of MoviPrep solution:    Empty 1 Pouch A and 1 Pouch B into the disposable container    Add lukewarm drinking water to the top line of the container. Mix to dissolve    Refrigerate  THE DAY OF YOUR PROCEDURE      DATE: 10/23/2010  DAY: FRIDAY  Beginning at8:30a.m. (5 hours before procedure):         1. Every 15 minutes, drink the solution down to the next mark (approx 8 oz) until the full liter is complete.  2. Follow completed prep with 16 oz. of clear liquid of your choice.    3. You may drink clear liquids until11:30AM (2 HOURS BEFORE PROCEDURE).   MEDICATION INSTRUCTIONS  Unless otherwise instructed, you should take regular prescription medications with a small sip of water   as early as possible the morning of your procedure.       OTHER INSTRUCTIONS  You will need a responsible adult at least 74 years of age to accompany you and drive you  home.   This person must remain in the waiting room during your procedure.  Wear loose fitting clothing that is easily removed.  Leave jewelry and other valuables at home.  However, you may wish to bring a book to read or  an iPod/MP3 player to listen to music as you wait for your procedure to start.  Remove all body piercing jewelry and leave at home.  Total time from sign-in until discharge is approximately 2-3 hours.  You should go home directly after your procedure and rest.  You can resume normal activities the  day after your procedure.  The day of your procedure you should not:   Drive   Make legal decisions   Operate machinery   Drink alcohol   Return to work  You will receive specific instructions about eating, activities and medications before you leave.    The above instructions have been reviewed and explained to me by   _______________________    I fully understand and can verbalize these instructions  _____________________________ Date _________

## 2010-11-19 NOTE — Letter (Signed)
Summary: New Patient letter  [facility]  [address] [place], [place] [address]   Phone: [phone]  Fax: [phone]       08/04/2010 MRN: [id number]  Mallory Hardy [address] [place], [place]  [address]  Dear Mallory Hardy,  Welcome to the Gastroenterology Division at [facility].    You are scheduled to see Dr.  Arlyce Dice on 09-15-10 at 3:30p.m. on the 3rd floor at [facility], [address]  We ask that you try to arrive at our office 15 minutes prior to your appointment time to allow for check-in.  We would like you to complete the enclosed self-administered evaluation form prior to your visit and bring it with you on the day of your appointment.  We will review it with you.  Also, please bring a complete list of all your medications or, if you prefer, bring the medication bottles and we will list them.  Please bring your insurance card so that we may make a copy of it.  If your insurance requires a referral to see a specialist, please bring your referral form from your primary care physician.  Co-payments are due at the time of your visit and may be paid by cash, check or credit card.     Your office visit will consist of a consult with your physician (includes a physical exam), any laboratory testing he/she may order, scheduling of any necessary diagnostic testing (e.g. x-ray, ultrasound, CT-scan), and scheduling of a procedure (e.g. Endoscopy, Colonoscopy) if required.  Please allow enough time on your schedule to allow for any/all of these possibilities.    If you cannot keep your appointment, please call [phone] to cancel or reschedule prior to your appointment date.  This allows [place] the opportunity to schedule an appointment for another patient in need of care.  If you do not cancel or reschedule by 5 p.m. the business day prior to your appointment date, you will be charged a $50.00 late cancellation/no-show fee.    Thank you for choosing  [facility] for your medical needs.  We appreciate the opportunity to care for you.  Please visit [place] at our website  to learn more about our practice.                     Sincerely,                                                             The Gastroenterology Division

## 2010-11-19 NOTE — Assessment & Plan Note (Signed)
Summary: Scabies Exposure   Vital Signs:  Patient profile:   76 year old female Height:      66.5 inches Weight:      166.75 pounds BMI:     26.61 O2 Sat:      95 % on Room air Temp:     97.9 degrees F oral Pulse rate:   67 / minute Resp:     20 per minute BP sitting:   110 / 60  (right arm) Cuff size:   regular  Vitals Entered By: Glendell Docker CMA (October 07, 2010 4:11 PM)  O2 Flow:  Room air CC: Exposed to Scabies Is Patient Diabetic? No Pain Assessment Patient in pain? no        Primary Care Provider:  Dondra Spry DO  CC:  Exposed to Scabies.  History of Present Illness: Mallory Hardy is a 76 year old female who presents today following a scabies exposure.  She reports that she has been caring for a child who has been diagnosed with scabies.  She has shared a bed with this child.  She reports that she has started itching  "all over."  She wonders if her itching is "all psychological."  She wishes to be treated.    Preventive Screening-Counseling & Management  Alcohol-Tobacco     Smoking Status: never  Allergies (verified): No Known Drug Allergies  Past History:  Past Medical History: Last updated: 09/04/2010 Current Problems:  UTI (ICD-599.0) HYPOTHYROIDISM (ICD-244.9)   MIGRAINE HEADACHE (ICD-346.90) - years ago   RENAL CALCULUS (ICD-592.0)  ALLERGIC RHINITIS, SEASONAL (ICD-477.0) HEADACHE (ICD-784.0) - sinus type headaches ANXIETY/DEPRESSION GERD chronic constipation   Diverticulosis  Past Surgical History: Last updated: 09/15/2010 thyroidectomy 1959- goiter lower abd. exploratory 1959   totator cuff repair 2007 bladder tacked 2004    ovaries removed 2003 Hysterectomy  1978  Physical Exam  General:  Well-developed,well-nourished,in no acute distress; alert,appropriate and cooperative throughout examination Skin:  Dry skin is noted, but no rashes or excoriation   Impression & Recommendations:  Problem # 1:  SCABIES  (ICD-133.0) Assessment New  Will plan empiric treatment with permethrin cream.  Instructed pt to repeat in 2 weeks if live mites noticed, or if itching not resolved.  Recommened  benadryl as needed for itching.  In the meantime, recommended that she wash all bedding and clothing that she and the child have come into contact with.  Pt education materials provided on scabies from [employer].  Orders: Prescription Created Electronically [phone])  Complete Medication List: 1)  Levoxyl 50 Mcg Tabs (Levothyroxine sodium) .... Take 1 tablet by mouth once a day 2)  Multivitamins Caps (Multiple vitamin) .... Take 1 tablet by mouth once a day 3)  Calcium 600/vitamin D 600-400 Mg-unit Tabs (Calcium carbonate-vitamin d) .... Take 1 tablet by mouth two times a day 4)  Bayer Aspirin Ec Low Dose 81 Mg Tbec (Aspirin) .... Take 1 tablet by mouth once a day 5)  Paroxetine Hcl 20 Mg Tabs (Paroxetine hcl) .... 1/2 tablet by mouth once daily 6)  Claritin 10 Mg Tabs (Loratadine) .... Take 1 tablet by mouth once a day as needed 7)  Hydrochlorothiazide 12.5 Mg Caps (Hydrochlorothiazide) .... One by mouth once daily 8)  Voltaren 1 % Gel (Diclofenac sodium) .... Apply three times a day x 1 week 9)  Dexilant 60 Mg Cpdr (Dexlansoprazole) .Marland Kitchen.. 1 by mouth two times a day 30 min before breakfast and 30 minutes before dinner 10)  [employer] (  Probiotic product) .Marland Kitchen.. 1 by mouth once daily 11)  Moviprep 100 Gm Solr (Peg-kcl-nacl-nasulf-na asc-c) .... As per prep instructions. 12)  Permethrin 5 % Crea (Permethrin) .... Apply entire skin from chin down to and including toes, (leave cream on for 14 hours)  Patient Instructions: 1)  Wash all bedding and clothing that has come in contact with infected persons. 2)  Call if your itching worsens, if you develop rash, or if symptoms are not completely resolved in 2 weeks. Prescriptions: PERMETHRIN 5 % CREA (PERMETHRIN) apply entire skin from chin down to and including  toes, (leave cream on for 14 hours)  #1 x 0   Entered and Authorized by:   Lemont Fillers FNP   Signed by:   Lemont Fillers FNP on 10/07/2010   Method used:   Electronically to        [facility] (retail)       [address]       [place], [place]  [address]       Ph: [phone]       Fax: [phone]   RxID:   [id number]    Orders Added: 1)  Est. Patient Level III [[address]] 2)  Prescription Created Electronically [phone]    Current Allergies (reviewed today): No known allergies

## 2010-11-19 NOTE — Progress Notes (Signed)
Summary: Triage  Phone Note Call from Patient Call back at Home Phone [phone]   Caller: Patient Call For: Dr. Arlyce Dice Reason for Call: Talk to Nurse Summary of Call: Pt has had diarrhea since christmas and also has a head cold, she is scheduled for a double procedure on friday and needs to see what she should do or if she should reschedule Initial call taken by: [place] Johnson,  October 21, 2010 1:07 PM  Follow-up for Phone Call        Patient states she has had no fever and no chest congestion. Instructed patient that she is ok to proceed with procedures on Friday as long as she has no fever or chest congestion. Follow-up by: Selinda Michaels RN,  October 21, 2010 1:27 PM

## 2010-11-19 NOTE — Progress Notes (Signed)
Summary: swallow test now or later   Phone Note Call from Patient Call back at Home Phone [phone]   Caller: Patient Call For: Kaycee Mcgaugh Summary of Call: patient stated you said during the visit you wanted her to have a swallow test.  did you mean you wanted that done soon or after her 6 month follow up. Initial call taken by: Roselle Locus,  January 08, 2010 9:20 AM  Follow-up for Phone Call        we will set up within 1-2 weeks Follow-up by: D. Thomos Lemons DO,  January 08, 2010 1:00 PM

## 2010-11-19 NOTE — Letter (Signed)
   [place] [facility] [address] [place], [place]  [address]  [place] Phone: [phone]      July 17, 2010   [name] [address] [place], [place] [address]  RE:  LAB RESULTS  Dear  Ms. DEETZ,  The following is an interpretation of your most recent lab tests.  Please take note of any instructions provided or changes to medications that have resulted from your lab work.  ELECTROLYTES:  Good - no changes needed  KIDNEY FUNCTION TESTS:  Good - no changes needed   THYROID STUDIES:  Thyroid studies normal TSH: 1.098       Vitamin D level -  63 (normal)       Sincerely Yours,    Dr. Thomos Lemons  Appended Document:  mailed

## 2010-11-19 NOTE — Assessment & Plan Note (Signed)
Summary: SORE THROAT COUGH/MHF-Rm 6   Vital Signs:  Patient profile:   76 year old female Height:      66.5 inches Weight:      165.50 pounds BMI:     26.41 Temp:     98.4 degrees F oral Pulse rate:   72 / minute Pulse rhythm:   regular Resp:     18 per minute BP sitting:   130 / 74  (left arm) Cuff size:   regular  Vitals Entered By: Mervin Kung CMA Duncan Dull) (August 24, 2010 8:17 AM) CC: Rm 6   Pt states she has had a sore throat since Friday. Cough and facial pain x 2 days. Is Patient Diabetic? No Pain Assessment Patient in pain? no      Comments Pt did not get Omeprazole-sodium. Takes Pepci and 1/4 tsp of soda daily. Nicki Guadalajara Fergerson CMA Duncan Dull)  August 24, 2010 8:25 AM    Primary Care Wendelin Bradt:  Dondra Spry DO  CC:  Rm 6   Pt states she has had a sore throat since Friday. Cough and facial pain x 2 days.Marland Kitchen  History of Present Illness: Mallory Hardy is a 76 year old female who presents with 4 day history of sore throat.  This is associated with HA and sinus pressure worse on the left side.  Denies fever.  Nasal drainage is clear.  + left ear pain, mild. + Post nasakl drip- dry cough.  Has tried chloraseptic spray and cough drops.  Has also tried generic "chestin" without improvement.  Denies known sick contacts.    Allergies (verified): No Known Drug Allergies  Past History:  Past Medical History: Last updated: 07/29/2010 Current Problems:  UTI (ICD-599.0) HYPOTHYROIDISM (ICD-244.9)   MIGRAINE HEADACHE (ICD-346.90) - years ago   RENAL CALCULUS (ICD-592.0)  ALLERGIC RHINITIS, SEASONAL (ICD-477.0) HEADACHE (ICD-784.0) - sinus type headaches ANXIETY/DEPRESSION GERD chronic constipation    Past Surgical History: Last updated: 07/29/2010 thyroidectomy 1959- goiter lower abd. exploratory 1959   totator cuff repair 2007 bladder tacked 2004    ovaries removed 2003    Review of Systems       see HPI  Physical Exam  General:   Well-developed,well-nourished,in no acute distress; alert,appropriate and cooperative throughout examination.  Mild voice hoarseness Head:  + bilateral maxillary sinus tenderness to palpation L>R Eyes:  pupils equal, pupils round, and pupils reactive to light.  Sclera are clear without injection Ears:  Bilateral serous effusion/ dull TM's without erythema or bulging Mouth:  + pharyngeal erythema without exudates Neck:  No deformities, masses, or tenderness noted. Lungs:  Normal respiratory effort, chest expands symmetrically. Lungs are clear to auscultation, no crackles or wheezes. Heart:  Normal rate and regular rhythm. S1 and S2 normal without gallop, murmur, click, rub or other extra sounds.   Impression & Recommendations:  Problem # 1:  SINUSITIS, ACUTE (ICD-461.9) Assessment New  Will plan to treat with amoxicillin.  Plan to treat sore throat symptoms conservatively as noted in patient sign out.  Rapid strep is negative.   Her updated medication list for this problem includes:    Amoxicillin 500 Mg Cap (Amoxicillin) .Marland Kitchen... Take 1 capsule by mouth three times a day x 10 days  Orders: Prescription Created Electronically [phone])  Complete Medication List: 1)  Levoxyl 50 Mcg Tabs (Levothyroxine sodium) .... Take 1 tablet by mouth once a day 2)  Multivitamins Caps (Multiple vitamin) .... Take 1 tablet by mouth once a day 3)  Calcium 600/vitamin D  600-400 Mg-unit Tabs (Calcium carbonate-vitamin d) .... Take 1 tablet by mouth two times a day 4)  Bayer Aspirin Ec Low Dose 81 Mg Tbec (Aspirin) .... Take 1 tablet by mouth once a day 5)  Paroxetine Hcl 20 Mg Tabs (Paroxetine hcl) .... 1/2 tablet by mouth once daily 6)  Miralax Powd (Polyethylene glycol 3350) .... Use as directed 7)  Claritin 10 Mg Tabs (Loratadine) .... Take 1 tablet by mouth once a day as needed 8)  Hydrochlorothiazide 12.5 Mg Caps (Hydrochlorothiazide) .... One by mouth once daily 9)  Voltaren 1 % Gel (Diclofenac sodium)  .... Apply three times a day x 1 week 10)  Omeprazole-sodium Bicarbonate 40-1100 Mg Caps (Omeprazole-sodium bicarbonate) .... One by mouth once daily 11)  Pepcid Ac Maximum Strength 20 Mg Tabs (Famotidine) .... Take 1 tablet with 1/4tsp soda daily. 12)  Amoxicillin 500 Mg Cap (Amoxicillin) .... Take 1 capsule by mouth three times a day x 10 days  Patient Instructions: 1)  Gargle twice daily with salt water. 2)  Take Tylenol 650mg  every 6 hours as needed for pain 3)  You may use over the counter Cepacol lozenges or Chloraseptic spray as needed for sore throat. 4)  Call if you develop fever over 101, increasing sinus pressure, pain with eye movement, increased facial tenderness of swelling, or if you develop visual changes. Prescriptions: AMOXICILLIN 500 MG CAP (AMOXICILLIN) Take 1 capsule by mouth three times a day X 10 days  #30 x 0   Entered and Authorized by:   Lemont Fillers FNP   Signed by:   Lemont Fillers FNP on 08/24/2010   Method used:   Electronically to        [facility] (retail)       [address]       [place], [place]  [address]       Ph: [phone]       Fax: [phone]   RxID:   [id number]    Orders Added: 1)  Est. Patient Level III [601[address]] 2)  Prescription Created Electronically [phone]    Current Allergies (reviewed today): No known allergies

## 2010-11-19 NOTE — Miscellaneous (Signed)
Summary: Mammogram  Clinical Lists Changes  Observations: Added new observation of MAMMOGRAM: abnormal left (08/20/2010 12:04)      Preventive Care Screening  Mammogram:    Date:  08/20/2010    Results:  abnormal left

## 2010-11-19 NOTE — Progress Notes (Signed)
Summary: Levoxyl  Phone Note Refill Request Message from:  Fax from Pharmacy on July 28, 2010 8:50 AM  Refills Requested: Medication #1:  LEVOXYL 50 MCG TABS Take 1 tablet by mouth once a day   Dosage confirmed as above?Dosage Confirmed   Brand Name Necessary? No   Supply Requested: 3 months   Last Refilled: 07/27/2010  Method Requested: Electronic Next Appointment Scheduled: 07-29-10 Dr Artist Pais  Initial call taken by: Roselle Locus,  July 28, 2010 8:51 AM  Follow-up for Phone Call        Rx completed in Dr. Tiajuana Amass Follow-up by: Glendell Docker CMA,  July 28, 2010 9:07 AM    Prescriptions: LEVOXYL 50 MCG TABS (LEVOTHYROXINE SODIUM) Take 1 tablet by mouth once a day  #90 x 2   Entered by:   Glendell Docker CMA   Authorized by:   D. Thomos Lemons DO   Signed by:   Glendell Docker CMA on 07/28/2010   Method used:   Electronically to        [facility] (retail)       [address]       [place], [place]  [address]       Ph: [phone]       Fax: [phone]   RxID:   [id number]

## 2010-11-19 NOTE — Assessment & Plan Note (Signed)
Summary: DIARRHEA SINCE CHRISTMAS/MHF   Vital Signs:  Patient profile:   76 year old female Height:      66.5 inches Weight:      164.50 pounds BMI:     26.25 O2 Sat:      98 % on Room air Temp:     97.7 degrees F oral Pulse rate:   66 / minute Pulse rhythm:   regular Resp:     20 per minute BP sitting:   120 / 70  (left arm) Cuff size:   large  Vitals Entered By: Mervin Kung CMA Duncan Dull) (October 26, 2010 3:38 PM)  O2 Flow:  Room air CC: Pt states she has had diarrhea every day since Christmas. Episodes occur 1-5 times daily. Is Patient Diabetic? No Pain Assessment Patient in pain? no      Comments Pt would like refill on or samples of Voltaren Gel. Pt unable to take Dexilant due to cost, did not get Hyomax b/c she didn't need it. Only taking paroxetine as needed. Completed Permethrin cream.  Mervin Kung CMA Duncan Dull)  October 26, 2010 3:52 PM    Primary Care Provider:  D. Thomos Lemons DO  CC:  Pt states she has had diarrhea every day since Christmas. Episodes occur 1-5 times daily.Marland Kitchen  History of Present Illness: Avg 5 BMs per day consistency thin and stringy, sometimes water possible mucus in BM foul odor to BM  [place] before symptoms started  Preventive Screening-Counseling & Management  Alcohol-Tobacco     Alcohol drinks/day: 0     Alcohol Counseling: not indicated; patient does not drink     Smoking Status: never     Passive Smoke Exposure: no     Tobacco Counseling: not indicated; no tobacco use  Allergies (verified): No Known Drug Allergies  Past History:  Past Medical History: Current Problems:  UTI (ICD-599.0) HYPOTHYROIDISM (ICD-244.9)   MIGRAINE HEADACHE (ICD-346.90) - years ago   RENAL CALCULUS (ICD-592.0)  ALLERGIC RHINITIS, SEASONAL (ICD-477.0) HEADACHE (ICD-784.0) - sinus type headaches  ANXIETY/DEPRESSION GERD chronic constipation   Diverticulosis  Past Surgical History: thyroidectomy 1959- goiter lower abd. exploratory  1959   totator cuff repair 2007 bladder tacked 2004    ovaries removed 2003 Hysterectomy  1978   Physical Exam  General:  alert, well-developed, and well-nourished.   Lungs:  normal respiratory effort and normal breath sounds.   Heart:  normal rate, regular rhythm, and no gallop.   Abdomen:  soft.  distended, mild diffuse tenderness, no rebound or guarding Extremities:  No lower extremity edema   Impression & Recommendations:  Problem # 1:  DIARRHEA, ACUTE (ICD-787.91) 76 y/o chronic diarrhea.  I suspect infectious etiology empiric cipro and flagyl trial of welchol  The following medications were removed from the medication list:    [facility] Caps (Probiotic product) .Marland Kitchen... 1 by mouth once daily  Orders: T-Basic Metabolic Panel [id number]) T-Culture, Stool ([id number]) T- * Misc. Laboratory test [id number]) T- * Misc. Laboratory test [phone]  Complete Medication List: 1)  Levoxyl 50 Mcg Tabs (Levothyroxine sodium) .... Take 1 tablet by mouth once a day 2)  Multivitamins Caps (Multiple vitamin) .... Take 1 tablet by mouth once a day 3)  Calcium 600/vitamin D 600-400 Mg-unit Tabs (Calcium carbonate-vitamin d) .... Take 1 tablet by mouth two times a day 4)  Bayer Aspirin Ec Low Dose 81 Mg Tbec (Aspirin) .... Take 1 tablet by mouth once a day 5)  Paroxetine Hcl 20 Mg Tabs (  Paroxetine hcl) .... 1/2 tablet by mouth once daily as needed. 6)  Claritin 10 Mg Tabs (Loratadine) .... Take 1 tablet by mouth once a day as needed 7)  Hydrochlorothiazide 12.5 Mg Caps (Hydrochlorothiazide) .... One by mouth once daily 8)  Voltaren 1 % Gel (Diclofenac sodium) .... Apply three times a day x 1 week as needed to neck. 9)  Pepcid Ac Maximum Strength 20 Mg Tabs (Famotidine) .... Take 1 tablet by mouth once a day as needed. 10)  Baking Soda  .... Take 1 pinch daily as needed. 11)  Ciprofloxacin Hcl 500 Mg Tabs (Ciprofloxacin hcl) .... One by mouth two times a day 12)  Metronidazole  500 Mg Tabs (Metronidazole) .... One by mouth three times a day 13)  Welchol 625 Mg Tabs (Colesevelam hcl) .... Two tabs by mouth three times a day  Patient Instructions: 1)  Please schedule a follow-up appointment in 2 weeks. 2)  Call our office if your diarrhea gets worse. Prescriptions: WELCHOL 625 MG TABS (COLESEVELAM HCL) one to two tabs by mouth two times a day as needed diarrhea  #60 x 0   Entered and Authorized by:   D. Thomos Lemons DO   Signed by:   D. Thomos Lemons DO on 10/26/2010   Method used:   Electronically to        [facility] (retail)       [address]       [place], [place]  [address]       Ph: [phone]       Fax: [phone]   RxID:   [id number] METRONIDAZOLE 500 MG TABS (METRONIDAZOLE) one by mouth three times a day  #21 x 0   Entered and Authorized by:   D. Thomos Lemons DO   Signed by:   D. Thomos Lemons DO on 10/26/2010   Method used:   Electronically to        [facility] (retail)       [address]       [place], [place]  [address]       Ph: [phone]       Fax: [phone]   RxID:   [id number] CIPROFLOXACIN HCL 500 MG TABS (CIPROFLOXACIN HCL) one by mouth two times a day  #14 x 0   Entered and Authorized by:   D. Thomos Lemons DO   Signed by:   D. Thomos Lemons DO on 10/26/2010   Method used:   Electronically to        [facility] (retail)       [address]       [place], [place]  [address]       Ph: [phone]       Fax: [phone]   RxID:   [id number]    Orders Added: 1)  T-Basic Metabolic Panel [id number] 2)  T-Culture, Stool [[id number]] 3)  T- * Misc. Laboratory test [99999] 4)  T- * Misc. Laboratory test [99999] 5)  Est. Patient Level III [[address]]    Current Allergies (reviewed today): No known allergies

## 2010-11-19 NOTE — Miscellaneous (Signed)
  Clinical Lists Changes  Medications: Added new medication of HYOMAX-DT 0.375 MG CR-TABS (HYOSCYAMINE SULFATE) take 1 tab two times a day as needed abd pain - Signed Rx of HYOMAX-DT 0.375 MG CR-TABS (HYOSCYAMINE SULFATE) take 1 tab two times a day as needed abd pain;  #15 x 1;  Signed;  Entered by: Louis Meckel MD;  Authorized by: Louis Meckel MD;  Method used: Electronically to [facility] [address] [place], [place], [place]  [address], Ph: [phone], Fax: [phone]    Prescriptions: HYOMAX-DT 0.375 MG CR-TABS (HYOSCYAMINE SULFATE) take 1 tab two times a day as needed abd pain  #15 x 1   Entered and Authorized by:   Louis Meckel MD   Signed by:   Louis Meckel MD on 10/23/2010   Method used:   Electronically to        [facility] (retail)       [address]       [place], [place]  [address]       Ph: [phone]       Fax: [phone]   RxID:   [id number]   Appended Document:  Pharmacy called and they cannot get this drug. They do have Hyoscyamine Sulfate 0.375mg . May substitute this, pharmacy notified.

## 2010-11-19 NOTE — Assessment & Plan Note (Signed)
Summary: physical and labs /mhf   Vital Signs:  Patient profile:   76 year old female Height:      66.5 inches Weight:      160.25 pounds BMI:     25.57 O2 Sat:      97 % on Room air Temp:     98.1 degrees F oral Pulse rate:   63 / minute Pulse rhythm:   regular Resp:     20 per minute BP sitting:   120 / 80  (right arm) Cuff size:   regular  Vitals Entered By: Glendell Docker CMA (January 08, 2010 8:19 AM)  O2 Flow:  Room air CC: Rm 3- CPX Comments fasting for labs   Primary Care Provider:  D. Thomos Lemons DO  CC:  Rm 3- CPX.  History of Present Illness: 76 y/o white female for routine CPX  Medical history and social hx reviewed. she has been tapering paxil.   she denies depressive symptoms  BP has been well controlled.   some mild wt gain.  she like sweets she started exercise program mild dyspnea with exertion which she attributes to deconditioning no chest pain she defers EKG.  she had EKG as another's physicians office (reported normal)    Preventive Screening-Counseling & Management  Alcohol-Tobacco     Smoking Status: never  Allergies (verified): No Known Drug Allergies  Past History:  Past Medical History: Current Problems:  UTI (ICD-599.0) HYPOTHYROIDISM (ICD-244.9)  MIGRAINE HEADACHE (ICD-346.90) - years ago   RENAL CALCULUS (ICD-592.0) ALLERGIC RHINITIS, SEASONAL (ICD-477.0) HEADACHE (ICD-784.0) - sinus type headaches ANXIETY/DEPRESSION GERD chronic constipation    Past Surgical History: thyroidectomy 1959- goiter lower abd. 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  The patient denies chest pain, peripheral edema, prolonged cough, abdominal pain, melena, hematochezia, severe indigestion/heartburn, and depression.         food comes back up if she bends forward,  she denies swallowing  Physical Exam  General:  alert, well-developed, and well-nourished.   Head:  normocephalic and atraumatic.   Eyes:  pupils equal, pupils round, and pupils reactive to light.   Ears:  R ear normal and L ear normal.   Mouth:  Oral mucosa and oropharynx without lesions or exudates.   Neck:  No deformities, masses, or tenderness noted.no carotid bruits.   Lungs:  normal respiratory effort and normal breath sounds.   Heart:  normal rate, regular rhythm, and no gallop.   Abdomen:  soft, non-tender, normal bowel sounds, no hepatomegaly, and no splenomegaly.   Extremities:  No lower extremity edema  Neurologic:  cranial nerves II-XII intact, gait normal, and DTRs symmetrical and normal.   Psych:  normally interactive, good eye contact, not anxious appearing, and not depressed appearing.     Impression & Recommendations:  Problem # 1:  PREVENTIVE HEALTH CARE (ICD-V70.0) Reviewed adult health maintenance protocols.  Pt couseled on diet and exercise.  Goal wt loss 5-10 lbs over next 6-12 months. rx  for shingles vaccine provided.  no hx of falls or gait instability.  Mammogram: normal (07/14/2009) Pap smear: normal (08/12/2009) Colonoscopy: Normal (06/01/2007) Bone Density: normal (04/05/2006) Flu Vax: Fluvax MCR (07/28/2009)   Pneumovax: given (08/11/2006) TSH: 0.965 (07/28/2009)     Problem # 2:  DYSPHAGIA UNSPECIFIED (ICD-787.20) Pt reports occ regurgitation of food with bending forward.  no dyphagia.   obtain barium swallow.  probable hiatal hernia  Orders: Misc. Referral (Misc. Ref)  Problem # 3:  HYPERTENSION (ICD-401.9) well controlled.  Maintain  current medication regimen.  Her updated medication list for this problem includes:    Hydrochlorothiazide 12.5 Mg Caps (Hydrochlorothiazide) ..... One by mouth once daily  Orders: T-Basic Metabolic Panel [id number]) T-Lipid Profile [id number]) T- * Misc. Laboratory test [id number])  BP today: 120/80 Prior BP: 140/80 (01/01/2010)  Labs Reviewed: K+: 4.2 (08/25/2009) Creat: : 0.83 (08/25/2009)     Problem # 4:  HYPOTHYROIDISM (ICD-244.9) Assessment: Improved Monitor TFT q 6 months Her updated medication list for this problem includes:    Levoxyl 50 Mcg Tabs (Levothyroxine sodium) .Marland Kitchen... Take 1 tablet by mouth once a day  Orders: T-TSH ([id number])  Complete Medication List: 1)  Levoxyl 50 Mcg Tabs (Levothyroxine sodium) .... Take 1 tablet by mouth once a day 2)  Multivitamins Caps (Multiple vitamin) .... Take 1 tablet by mouth once a day 3)  Calcium 600/vitamin D 600-400 Mg-unit Tabs (Calcium carbonate-vitamin d) .... Take 1 tablet by mouth two times a day 4)  Bayer Aspirin Ec Low Dose 81 Mg Tbec (Aspirin) .... Take 1 tablet by mouth once a day 5)  Paroxetine Hcl 20 Mg Tabs (Paroxetine hcl) .... 1/2 tablet by mouth once daily 6)  Miralax Powd (Polyethylene glycol 3350) .... Use as directed 7)  Claritin 10 Mg Tabs (Loratadine) .... Take 1 tablet by mouth once a day as needed 8)  Hydrochlorothiazide 12.5 Mg Caps (Hydrochlorothiazide) .... One by mouth once daily 9)  Fluticasone Propionate 50 Mcg/act Susp (Fluticasone propionate) .... 2 sprays each nostril once daily 10)  Zantac 150 Mg Tabs (Ranitidine hcl) .... Take 1 tablet by mouth once a day as needed 11)  Zostavax [id number] Unt/0.69ml Solr (Zoster vaccine live) .... Administer vaccine x 1  Patient Instructions: 1)  Please schedule a follow-up appointment in 6 months  Prescriptions: HYDROCHLOROTHIAZIDE 12.5 MG CAPS (HYDROCHLOROTHIAZIDE) one by mouth once daily  #90 x 3   Entered and Authorized by:   D. Thomos Lemons DO   Signed  by:   D. Thomos Lemons DO on 01/08/2010   Method used:   Electronically to        [facility] [address] (retail)       [address]       [place], [place]  [address]       Ph: [phone]       Fax: [phone]   RxID:   [id number] ZOSTAVAX [id number] UNT/0.65ML SOLR (ZOSTER VACCINE LIVE) administer vaccine x 1  #1 x 0   Entered and Authorized by:   D. Thomos Lemons DO   Signed by:   D. Thomos Lemons DO on 01/08/2010   Method used:   Print then Give to Patient   RxID:   [id number]     Current Allergies (reviewed today): No known allergies

## 2010-11-25 ENCOUNTER — Ambulatory Visit: Payer: Self-pay | Admitting: Gastroenterology

## 2010-11-25 NOTE — Assessment & Plan Note (Signed)
Summary: PER CONVERSATION WITH DARLENE/MHF   Vital Signs:  Patient profile:   76 year old female Height:      66.5 inches Weight:      160.75 pounds BMI:     25.65 O2 Sat:      98 % on Room air Temp:     97.4 degrees F oral Pulse rate:   77 / minute Resp:     16 per minute BP sitting:   118 / 72  (right arm) Cuff size:   regular  Vitals Entered By: Glendell Docker CMA (November 02, 2010 9:15 AM)  O2 Flow:  Room air CC: Diarrhea-unresolved Is Patient Diabetic? No Pain Assessment Patient in pain? no      Comments discuss unresolved diarrhea, stools have been black since last week, discuss diet- spoke with Dr Arlyce Dice office on Friday, she had vomiting on Friday after starting Brat Diet. She was advised by Dr Arlyce Dice office to schedule a follow up-she declined because she had this appointment   Primary Care Provider:  Dondra Spry DO  CC:  Diarrhea-unresolved.  History of Present Illness: 76 y/o white female for follow up pt c/o persistent lower abd discomfort 5 BMs to 2 per day consistency getting slightly firmer stools started to appear black for last several days pt worried she could be bleeding internally took peptobismol last week x 1 dose   Allergies (verified): No Known Drug Allergies  Past History:  Past Medical History: Current Problems:  UTI (ICD-599.0)  HYPOTHYROIDISM (ICD-244.9)   MIGRAINE HEADACHE (ICD-346.90) - years ago   RENAL CALCULUS (ICD-592.0)  ALLERGIC RHINITIS, SEASONAL (ICD-477.0) HEADACHE (ICD-784.0) - sinus type headaches ANXIETY/DEPRESSION GERD chronic constipation   Diverticulosis  Family History: Family History High cholesterol Family History of  stroke Family History of CAD Female 1st degree relative <60 Family History Hypertension  No FH of Colon Cancer:    Social History: 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   Lungs:  Normal respiratory effort, chest expands symmetrically. Lungs are clear to auscultation, no crackles or wheezes. Heart:  Normal rate and regular rhythm. S1 and S2 normal without gallop, murmur, click, rub or other extra sounds. Abdomen:  soft.  LLQ tenderness,  no rebound or guarding Rectal:  heme negative Extremities:  No lower extremity edema    Impression & Recommendations:  Problem # 1:  ABDOMINAL PAIN, LEFT LOWER QUADRANT (ICD-789.04) 75 y/o with probable infectious diarrhea with LLQ pain.  question colitis obtain CT of abd and pelvis continue antibiotics  Orders: CT with Contrast (CT w/ contrast)  Complete Medication List: 1)  Levoxyl 50 Mcg Tabs (Levothyroxine sodium) .... Take 1 tablet by mouth once a day 2)  Multivitamins Caps (Multiple vitamin) .... Take 1 tablet by mouth once a day 3)  Calcium 600/vitamin D 600-400 Mg-unit Tabs (Calcium carbonate-vitamin d) .... Take 1 tablet by mouth two times a day 4)  Bayer Aspirin Ec Low Dose 81 Mg Tbec (Aspirin) .... Take 1 tablet by mouth once a day 5)  Paroxetine Hcl 20 Mg Tabs (Paroxetine hcl) .... 1/2 tablet by mouth once daily as needed. 6)  Claritin 10 Mg Tabs (Loratadine) .... Take 1 tablet by mouth once a day as needed 7)  Hydrochlorothiazide 12.5 Mg Caps (Hydrochlorothiazide) .... One by mouth once daily 8)  Voltaren 1 % Gel (Diclofenac sodium) .... Apply three times a day x 1 week as needed to neck. 9)  Pepcid Ac Maximum Strength 20 Mg Tabs (Famotidine) .... Take 1 tablet by mouth once a day as needed. 10)  Baking Soda  .... Take 1 pinch daily as needed. 11)  Ciprofloxacin Hcl 500 Mg Tabs (Ciprofloxacin hcl) .... One by mouth two times a day 12)  Metronidazole 500 Mg Tabs (Metronidazole) .... One by mouth three times a day 13)  Welchol 625 Mg Tabs (Colesevelam hcl) .... Two tabs by mouth three times a day  Patient  Instructions: 1)  Stop taking OTC "colon health" supplement 2)  Call our office if your symptoms do not  improve or gets worse.   Orders Added: 1)  CT with Contrast [CT w/ contrast] 2)  Est. Patient Level III [82956]    Current Allergies (reviewed today): No known allergies

## 2011-01-06 LAB — POCT I-STAT 4, (NA,K, GLUC, HGB,HCT)
Glucose, Bld: 128 mg/dL — ABNORMAL HIGH (ref 70–99)
Hemoglobin: 21.1 g/dL (ref 12.0–15.0)
Potassium: 3.5 mEq/L (ref 3.5–5.1)
Sodium: 143 mEq/L (ref 135–145)

## 2011-01-13 ENCOUNTER — Encounter: Payer: Self-pay | Admitting: Gastroenterology

## 2011-01-13 ENCOUNTER — Ambulatory Visit (INDEPENDENT_AMBULATORY_CARE_PROVIDER_SITE_OTHER): Payer: Medicare Other | Admitting: Gastroenterology

## 2011-01-13 DIAGNOSIS — K219 Gastro-esophageal reflux disease without esophagitis: Secondary | ICD-10-CM

## 2011-01-13 DIAGNOSIS — R195 Other fecal abnormalities: Secondary | ICD-10-CM

## 2011-01-13 NOTE — Assessment & Plan Note (Signed)
Plan followup Hemoccults

## 2011-01-13 NOTE — Assessment & Plan Note (Signed)
Symptoms are well controlled on Pepcid. Will continue with the same.

## 2011-01-13 NOTE — Progress Notes (Signed)
Mallory Hardy has returned for followup of her GERD. In January, 2012 she underwent upper endoscopy and colonoscopy. The former demonstrated an early esophageal stricture. Internal hemorrhoids were seen on the latter. She was Hemoccult positive several months ago. On Pepcid her reflux symptoms are well controlled. Her only complaint is occasional regurgitation of gastric contents if she bends over. She's had no rectal bleeding or melena.

## 2011-01-13 NOTE — Patient Instructions (Addendum)
Cc. Mallory Hardy You will go to the basement for hemoccult test today

## 2011-01-14 ENCOUNTER — Ambulatory Visit: Payer: Self-pay | Admitting: Internal Medicine

## 2011-01-15 ENCOUNTER — Ambulatory Visit (INDEPENDENT_AMBULATORY_CARE_PROVIDER_SITE_OTHER): Payer: Medicare Other | Admitting: Internal Medicine

## 2011-01-15 ENCOUNTER — Encounter: Payer: Self-pay | Admitting: Internal Medicine

## 2011-01-15 DIAGNOSIS — E039 Hypothyroidism, unspecified: Secondary | ICD-10-CM

## 2011-01-15 DIAGNOSIS — M542 Cervicalgia: Secondary | ICD-10-CM

## 2011-01-15 DIAGNOSIS — I8390 Asymptomatic varicose veins of unspecified lower extremity: Secondary | ICD-10-CM

## 2011-01-15 DIAGNOSIS — G8929 Other chronic pain: Secondary | ICD-10-CM | POA: Insufficient documentation

## 2011-01-15 DIAGNOSIS — R195 Other fecal abnormalities: Secondary | ICD-10-CM

## 2011-01-15 DIAGNOSIS — I10 Essential (primary) hypertension: Secondary | ICD-10-CM

## 2011-01-15 LAB — CBC
MCH: 31.4 pg (ref 26.0–34.0)
MCV: 94.3 fL (ref 78.0–100.0)
Platelets: 247 10*3/uL (ref 150–400)
RDW: 13.2 % (ref 11.5–15.5)
WBC: 6.5 10*3/uL (ref 4.0–10.5)

## 2011-01-15 LAB — TSH: TSH: 0.813 u[IU]/mL (ref 0.350–4.500)

## 2011-01-15 NOTE — Assessment & Plan Note (Signed)
Well controlled.   Continue current medication regimen.  BP Readings from Last 3 Encounters:  01/15/11 132/72  01/13/11 120/68  11/02/10 118/72    Lab Results  Component Value Date   CREATININE 0.86 10/27/2010

## 2011-01-15 NOTE — Assessment & Plan Note (Signed)
Monitor TFTs Lab Results  Component Value Date   TSH 1.098 07/16/2010

## 2011-01-15 NOTE — Assessment & Plan Note (Signed)
Pt asymptomatic.   She had small laceration - now healed. "old blood oozed out"  If she becomes symptomatic - use compression stockings.    Consider laser tx

## 2011-01-15 NOTE — Assessment & Plan Note (Signed)
Mild chronic left sided neck pain Probable DJD Reviewed back exercises Use voltaren gel as needed

## 2011-01-15 NOTE — Patient Instructions (Signed)
Hold baby aspirin Perform back exercises as directed

## 2011-01-15 NOTE — Assessment & Plan Note (Signed)
Hold ASA until immunoassay for FOBT final.  Repeat CBC

## 2011-01-15 NOTE — Progress Notes (Signed)
  Subjective:    Patient ID: Mallory Hardy, female    DOB: [birth year], 76 y.o.   MRN: [id number]  HPI 76 y/o female for follow up Loose stools have resolved.  Pt seen by Dr. Arlyce Dice re: change in stool color.  FOBT pending.  Htn - stable  Pt c/o left leg irritation.  Pt suffered mild trauma to left lower ext.  She noticed dark , old blood.  She has chronic varicose veins. Denies chronic leg pain or fatigue  Hypothyroidism - wt stable   Review of Systems    intermittent left neck pain .  Worse with prolonged reading  BP 132/72  Pulse 68  Temp(Src) 97.8 F (36.6 C) (Oral)  Resp 20  Ht 5' 6.5" (1.689 m)  Wt 164 lb (74.39 kg)  BMI 26.07 kg/m2  SpO2 99%   Objective:   Physical Exam  Constitutional: She appears well-developed and well-nourished.  Eyes: Conjunctivae are normal.  Cardiovascular: Normal rate and regular rhythm.   Pulmonary/Chest: Effort normal and breath sounds normal.  Skin: Skin is warm and dry. No rash noted.       Bilateral varicose veins          Assessment & Plan:

## 2011-01-16 LAB — BASIC METABOLIC PANEL WITH GFR
Calcium: 9.4 mg/dL (ref 8.4–10.5)
Creat: 0.86 mg/dL (ref 0.40–1.20)
GFR, Est Non African American: >60 mL/min (ref >60)

## 2011-01-18 ENCOUNTER — Encounter: Payer: Self-pay | Admitting: Internal Medicine

## 2011-02-19 ENCOUNTER — Telehealth: Payer: Self-pay | Admitting: Internal Medicine

## 2011-02-19 NOTE — Telephone Encounter (Signed)
Pt states that she had a stool test at the end of march and has not heard back regarding the results. Pt states that dr. Artist Pais told her to stay off aspirin until she receives the results. Pt wants to know results of stool test and if she needs to start taking aspirin again.

## 2011-02-25 NOTE — Telephone Encounter (Signed)
Spoke to [place] at [place] lab, she does not have record of IFOB being received by the lab. Left message on machine for pt to return my call.

## 2011-03-01 ENCOUNTER — Telehealth: Payer: Self-pay | Admitting: *Deleted

## 2011-03-01 NOTE — Telephone Encounter (Signed)
Patient returned the call stating she was away on vacation she stated who stool card was dropped off to the lab downstairs one week after he office visit with Dr Artist Pais. Call placed to [place] she states she recalled sending the specimen to [facility] to process. Call placed to [facility] and I spoke with Selena Batten, who informed test was negative. Call was returned to patient, she was advised of lab results and she is wanting to know if she should continue taking her aspirin.   She can be reached at [phone]. If no answer, patient stated okay to leave message

## 2011-03-03 NOTE — Telephone Encounter (Signed)
Call placed to patient at [phone], she has been advised per Dr Artist Pais instructions.

## 2011-03-03 NOTE — Telephone Encounter (Signed)
I would not resume aspirin therapy Pt has hx of esophageal stricture and GERD.

## 2011-03-05 NOTE — H&P (Signed)
NAME:  Mallory Hardy, Mallory Hardy                        ACCOUNT NO.:  [id number]   MEDICAL RECORD NO.:  [id number]                   PATIENT TYPE:   LOCATION:                                       FACILITY:  [facility]   PHYSICIAN:  Daniel L. Eda Paschal, M.D.           DATE OF BIRTH:  09-16-1935   DATE OF ADMISSION:  08/21/2002  DATE OF DISCHARGE:                                HISTORY & PHYSICAL   CHIEF COMPLAINT:  Pelvic mass.   HISTORY OF PRESENT ILLNESS:  The patient is a 76 year old, gravida 3, para  2, AB 1 who had gone to see Larey Dresser as a result of hematuria. CT of  her abdomen and pelvis revealed a left adnexal mass. She was seen in our  office. Ultrasound confirmed a complex mass of her left ovary of  approximately 6 cm. It had both solid and cystic components to it. There was  a little bit of fluid surrounding it. She had a normal CA 125. We waited  four weeks just to be sure that this was not hemorrhage that was causing the  solid component and she was rescanned and it is unchanged so she now enters  the hospital for exploratory laparotomy with bilateral salpingo-  oophorectomy. She appreciates that if it is malignant, she will undergo  omentectomy, lymph node sampling and other appropriate surgery. She has had  a bowel prep because of this complex mass and Dr. De Blanch is  on standby.   PAST MEDICAL HISTORY:  The patient had a vaginal hysterectomy in 1978. She  had a thyroidectomy in 1959. In 1959, she also had exploratory laparotomy  because they thought she was going to have endometriosis but she did not.  She is hypothyroid.   CURRENT MEDICATIONS:  Synthroid 0.05 m daily, Estratest HS one daily,  calcium and a baby aspirin, one daily.   FAMILY HISTORY:  Completely noncontributory.   SOCIAL HISTORY:  She is a nonsmoker, nondrinker.   REVIEW OF SYMPTOMS:  HEENT:  Negative. CARDIAC:  Negative.  RESPIRATORY:  Negative.  GI:  Negative.  GU:  Reveals a history  of a kidney stone as well  as a small cystocele. NEUROLOGIC/PSYCHIATRIC:  Negative.  ALLERGIC/IMMUNOLOGIC:  Negative.  LYMPHATIC/ENDOCRINE:  Reveals a history of  hypothyroidism.   PHYSICAL EXAMINATION:  GENERAL:  The patient is a well-developed, well-  nourished female in no acute distress.   VITAL SIGNS:  Blood pressure is 120/74, pulse 80 and regular, respirations  16 nonlabored. She is afebrile.   HEENT:  Within normal limits.   NECK:  Supple.  Trachea in the midline. Thyroid is not enlarged.   LUNGS:  Clear to P&A.   HEART:  No thrills, heaves or murmurs.   BREASTS:  No masses.   ABDOMEN:  Soft without guarding, rebound or masses.   PELVIC:  External is within normal limits.  BUS is within normal limits.  Vaginal examination  reveals  1 1/2 degree cystocele and a first degree  rectocele. Bimanual and rectal reveals a 6 cm left adnexal mass,  rectovaginal is confirmatory.   EXTREMITIES:  Within normal limits.   IMPRESSION:  Persistent complex left adnexal mass.   PLAN:  See above.                                               Daniel L. Eda Paschal, M.D.    [name]  D:  08/21/2002  T:  08/21/2002  Job:  [id number]

## 2011-03-05 NOTE — H&P (Signed)
NAME:  Mallory Hardy, Mallory Hardy                          ACCOUNT NO.:  [id number]   MEDICAL RECORD NO.:  [id number]                    PATIENT TYPE:   LOCATION:                                       FACILITY:   PHYSICIAN:  Daniel L. Eda Paschal, M.D.           DATE OF BIRTH:   DATE OF ADMISSION:  DATE OF DISCHARGE:                                HISTORY & PHYSICAL   CHIEF COMPLAINT:  Symptomatic cystocele/rectocele.   HISTORY OF PRESENT ILLNESS:  Patient is a 76 year old gravida 3, para 2-0-1-  2, who presented to the office in July 2004 with a history of being aware of  a large bulge coming out of her vagina.  It is very uncomfortable when she  sits.  She is also having trouble getting stool out.  She is not having  recurrent urinary tract infections or urinary incontinence.  Findings showed  a cystocele and a rectocele, which explained the above.  She also had a  urethral caruncle that Dr. Vonita Moss is going to excise at the same time.  He  does not feel the urodynamics were appropriate nor did he think that she  needed a sling procedure for correction of occult stress that was being  masked by the cystocele.  She, therefore, is in the hospital now for  anterior and posterior repair as well as repair of urethral caruncle.   PAST MEDICAL HISTORY:  Patient is hypothyroid.   MEDICATIONS:  She is on Synthroid 0.05 mg daily.   PAST SURGICAL HISTORY:  1. She is status post vaginal hysterectomy, done in 1978 for uterine     prolapse.  2. Thyroidectomy, done in 1959.  3. She had an exploratory laparotomy in 1959 as well with endometriosis     suspected, although it was not found.  4. She had multiple vaginal biopsies done in 2002 for abnormal mucosa of the     vagina, which turned out to have ulceration and chronic inflammation but     no pre-cancerous lesion.  She also had an exploratory laparotomy with     bilateral salpingo-oophorectomy 2003 for a serous cystadenofibroma of the     left  ovary and a benign serous cystadenofibroma of the right ovary as     well.   ALLERGIES:  She is allergic to no drugs.   FAMILY HISTORY:  Noncontributory.   SOCIAL HISTORY:  She is a nonsmoker, nondrinker.   REVIEW OF SYSTEMS:  HEENT:  Negative.  CARDIAC:  Negative.  RESPIRATORY:  Negative.  GASTROINTESTINAL:  Negative.  GENITOURINARY:  See above.  NEUROLOGICAL:  Negative.  PSYCHIATRIC:  Negative.  ALLERGIC/IMMUNOLOGICAL/LYMPHATIC/ENDOCRINE:  All negative.   PHYSICAL EXAMINATION:  GENERAL:  Patient is a well-developed and well-  nourished female in no acute distress.  VITAL SIGNS:  Blood pressure is 120/74; pulse is 80 and regular,  respirations 16 nonlabored; she is afebrile.  HEENT:  All within normal limits.  NECK:  Supple.  Trachea in the midline.  Thyroid is not enlarged.  LUNGS:  Clear to P & A.  HEART:  No thrills, heaves or murmurs.  BREASTS:  No masses.  ABDOMEN:  Soft without guarding, rebound or masses.  PELVIC:  External is within normal limits.  BUS reveals a urethral caruncle.  Bladder reveals a second-degree cystocele.  Vagina reveals a first-degree  rectocele.  Cervix and uterus are absent.  Adnexa are absent, and there are  no masses.  On bimanual, rectovaginal is confirmatory.  Anus and perineum  are normal.   ADMISSION IMPRESSION:  Symptomatic cystocele/rectocele and urethral  caruncle.   PLAN:  See above.                                               Daniel L. Eda Paschal, M.D.    [name]  D:  06/03/2003  T:  06/03/2003  Job:  [id number]

## 2011-03-05 NOTE — Discharge Summary (Signed)
   NAME:  Mallory Hardy, Mallory Hardy                        ACCOUNT NO.:  [id number]   MEDICAL RECORD NO.:  [id number]                   PATIENT TYPE:  INP   LOCATION:  0447                                 FACILITY:  [facility]   PHYSICIAN:  Rande Brunt. Eda Paschal, M.D.           DATE OF BIRTH:  [birth year]   DATE OF ADMISSION:  06/03/2003  DATE OF DISCHARGE:  06/05/2003                                 DISCHARGE SUMMARY   The patient is a 76 year old female, who was admitted to the hospital with a  cystocele, rectocele, and urethral caruncle for definitive surgery.  On the  day of admission, she was taken to the operating room and Daniel L.  Gottsegen, M.D. did an anterior and posterior repair, and [employer]. Vonita Moss,  M.D. excised the urethral caruncle and cystoscoped the patient.  Postoperatively, she did well.  On the first postoperative day, she had a  lot of dizziness when she got up.  Her blood pressure was slightly low.  Her  blood count was stable.  She was watched carefully because the dizziness was  significant enough that she was not stable enough to be discharged.  By the  second postoperative day, she was much more stable; her dizziness had  diminished.  Her hemoglobin dropped to 9.7, and it stabilized there.  She  was sent home.  She will be seen in the office in two weeks by Reuel Boom L.  Gottsegen, M.D. and in two weeks by Maretta Bees. Vonita Moss, M.D.   DISCHARGE DIAGNOSES:  1. Cystocele.  2. Rectocele.  3. Urethral caruncle.   OPERATION:  1. Anterior and posterior repair.  2. Incision of urethral caruncle.  3. Cystoscopy.                                               Daniel L. Eda Paschal, M.D.    [name]  D:  06/20/2003  T:  06/20/2003  Job:  [id number]

## 2011-03-05 NOTE — Op Note (Signed)
   NAME:  Mallory Hardy, Mallory Hardy NO.:  [id number]   MEDICAL RECORD NO.:  [id number]                   PATIENT TYPE:  INP   LOCATION:  0010                                 FACILITY:  [facility]   PHYSICIAN:  Rande Brunt. Eda Paschal, M.D.           DATE OF BIRTH:  14-Mar-1935   DATE OF PROCEDURE:  06/03/2003  DATE OF DISCHARGE:                                 OPERATIVE REPORT   PREOPERATIVE DIAGNOSIS:  Cystocele/rectocele.   POSTOPERATIVE DIAGNOSIS:  Cystocele/rectocele.   OPERATION:  Anterior and posterior repair.   SURGEON:  Daniel L. Eda Paschal, M.D.   FIRST ASSISTANT:  Juan H. Lily Peer, M.D.   FINDINGS:  At the time of surgery the patient had a second-degree cystocele  and a second-degree rectocele.  She had no loss of urethrovesical angle.  She had good vault support.  She had no enterocele.  She had a urethral  carbuncle.   PROCEDURE:  After adequate general anesthesia, the patient was placed in the  dorsal lithotomy position; prepped and draped in the usual sterile manner.  An inverted T incision was made to initiate the anterior repair.  The  anterior vaginal mucosa was sharply dissected free from the cystocele and  the perivesical fascia.  Once it was dissected free, a Foley catheter was  inserted.  The cystocele was reduced with approximately eight sutures of 2-0  Vicryl, bringing together good vesicle fascia.  The redundant vaginal mucosa  was trimmed away.  The anterior wall was then closed with a running, locking  2-0 Vicryl, picking up both vaginal mucosa and the underlying fascia.   Attention was next turned to the posterior repair.  Starting at the  introitus, the posterior vaginal mucosa was undermined to the top of the  cuff.  The perirectal fascia was sharply dissected free.  The surgeon put  his finger in the rectum, and the rectocele was also dissected free.  Following this, the rectocele was reduced with six interrupted sutures of 2-  0  Vicryl.  The patient had good perirectal fascia.  Redundant vaginal mucosa  was trimmed away.  The posterior vaginal cuff was then closed with a running  2-0 Vicryl, picking up the perirectal fascia below the mucosa as well as the  mucosa.   The estimated blood loss for the entire procedure was 300 cc with none  replaced.  Following this,  Dr. Maretta Bees. Vonita Moss did a urethral carbuncle repair, and then the vagina  was packed.  The patient left the operating room in satisfactory condition.                                               Daniel L. Eda Paschal, M.D.    [name]  D:  06/03/2003  T:  06/03/2003  Job:  [id number]

## 2011-03-05 NOTE — Discharge Summary (Signed)
   NAME:  Mallory Hardy, METZGER                        ACCOUNT NO.:  [id number]   MEDICAL RECORD NO.:  [id number]                   PATIENT TYPE:  INP   LOCATION:  0462                                 FACILITY:  [facility]   PHYSICIAN:  Rande Brunt. Eda Paschal, M.D.           DATE OF BIRTH:  [birth year]   DATE OF ADMISSION:  08/21/2002  DATE OF DISCHARGE:  08/24/2002                                 DISCHARGE SUMMARY   HOSPITAL COURSE:  The patient is a 76 year old female who was admitted to  the hospital with bilateral adnexal masses.  On the day of admission she was  taken to the operating room and exploratory laparotomy with bilateral  salpingo-oophorectomy was performed.  Frozen section was benign serous  cystadenofibromas of both ovaries.  Postoperatively she had some trouble  with ileus.  She responded to IVs, restricting fluids, Dulcolax  suppositories.  Finally, by the third postoperative day, she was ready for  discharge.   DISCHARGE MEDICATIONS:  Tylox for pain relief, and all her preadmission  medications.   DIET:  Regular.   ACTIVITY:  Ambulatory.   CONDITION ON DISCHARGE:  Improved.   FOLLOW-UP:  The patient will return to our office on Tuesday for staple  removal.   PATHOLOGY:  Final pathology report came back showing a focal borderline  change in the left ovarian tumor.  Peritoneal washings were negative.  Dr.  Stanford Breed was consulted by telephone.  He felt that the patient had  been adequately treated.   DISCHARGE DIAGNOSIS:  Serous cystadenofibromas of both ovaries with  borderline focal change of the left ovary.   OPERATION:  Exploratory laparotomy with bilateral salpingo-oophorectomy.                                                Daniel L. Eda Paschal, M.D.    [name]  D:  08/24/2002  T:  08/24/2002  Job:  [id number]

## 2011-03-05 NOTE — Op Note (Signed)
NAME:  Mallory Hardy, Mallory Hardy                        ACCOUNT NO.:  [id number]   MEDICAL RECORD NO.:  [id number]                   PATIENT TYPE:  INP   LOCATION:  NA                                   FACILITY:  [facility]   PHYSICIAN:  Daniel L. Eda Paschal, M.D.           DATE OF BIRTH:  1935-06-04   DATE OF PROCEDURE:  08/21/2002  DATE OF DISCHARGE:                                 OPERATIVE REPORT   PREOPERATIVE DIAGNOSIS:  Left adnexal mass.   POSTOPERATIVE DIAGNOSIS:  Benign serous cyst adenofibromas.   OPERATION:  Exploratory laparotomy with bilateral salpingo-oophorectomy.   SURGEON:  Daniel L. Eda Paschal, M.D.   FIRST ASSISTANT:  Timothy P. Fontaine, M.D.   FINDINGS:  At the time of surgery, the patient's left ovary was enlarged to  approximately 6-7 cm by mass which had a smooth capsule and appeared to be  at least partially cystic.  The patient's right ovary was completely free  but did have a small cystic area as well of about 1.5-2 cm.  The left adnexa  was somewhat adherent to the peritoneum and to the bladder flap.  The rest  of the exploration of the abdomen was normal.   DESCRIPTION OF PROCEDURE:  After adequate general endotracheal anesthesia,  the patient was placed in the supine position, prepped and draped in the  usual sterile manner.  Foley catheter was inserted in the patient's bladder.  A midline vertical incision was made and extended through the fascia and the  peritoneum in a vertical fashion.  Bleeders were controlled with the Bovie.  When the peritoneal cavity was opened, no obvious signs of malignancy were  noted.  Peritoneal washings were obtained.  Attention was next turned to the  larger left adnexal mass.  The retroperitoneal space was entered.  The  ureter was identified.  The IP ligament was clamped, cut, and doubly suture  ligated with #1 chromic catgut.  The round ligament was then Bovied and cut  and then taking care to keep the ureter in sight the  entire time, all other  surrounding structures were sharply lysed until the adnexa was free.  Several areas were coagulated for bleeding, staying away from the ureter.  Attention was next turned to the right ovary.  The round ligament was Bovied  and cut.  The retroperitoneal space was opened; the ureter was identified.  The IP ligament on the right was doubly ligated with #1 chromic catgut and  once again, keeping the ureter in site.  The adnexa was now freed up from  all surrounding structures and removed.  Frozen sections were obtained on  both sides, and they both revealed benign serous cyst adenofibromas with no  evidence of borderline potential.  At this point, copious irrigation was  done with Ringer's lactate.  Two sponge, needle, and instrument counts were  correct.  The peritoneum and the fascia were closed with two running 0 PDS  sutures.  It was a double band of suture.  Copious irrigation was then done  of the subcutaneous tissue, and the skin was closed with staples.  Estimated  blood loss for the entire procedure was less than 100 cc with none replaced.  The patient tolerated the procedure well and left operating room in  satisfactory condition draining clear urine from her Foley catheter.                                               Daniel L. Eda Paschal, M.D.    [name]  D:  08/21/2002  T:  08/21/2002  Job:  [id number]

## 2011-03-05 NOTE — Op Note (Signed)
   NAME:  Mallory Hardy, Mallory Hardy NO.:  [id number]   MEDICAL RECORD NO.:  [id number]                   PATIENT TYPE:  INP   LOCATION:  0010                                 FACILITY:  [facility]   PHYSICIAN:  Maretta Bees. Vonita Moss, M.D.             DATE OF BIRTH:  [birth year]   DATE OF PROCEDURE:  06/03/2003  DATE OF DISCHARGE:                                 OPERATIVE REPORT   PREOPERATIVE DIAGNOSIS:  Urethral caruncle.   POSTOPERATIVE DIAGNOSIS:  Urethral caruncle.   OPERATION PERFORMED:  Cystoscopy and excision of urethral caruncle.   SURGEON:  Maretta Bees. Vonita Moss, M.D.   ANESTHESIA:  General.   INDICATIONS FOR PROCEDURE:  This lady was undergoing anterior and posterior  repair by Reuel Boom L. Gottsegen, M.D. for a cystocele and rectocele and  because of intermittent discomfort from a caruncle, excision of that lesion  was also planned to be done by me.   DESCRIPTION OF PROCEDURE:  The patient was brought to the operating room and  placed in lithotomy position and underwent an AP repair by Reuel Boom L.  Eda Paschal, M.D.  I then scrubbed in and an erythematous urethral caruncle  was noted.  It was excised sharply and then closed with 4-0 chromic catgut.  I then cystoscoped her and there were no intravesical or urethral lesions.  The Foley catheter was reinserted and connected to closed drainage and I  packed her vaginal canal with iodoform gauze and she left the operating room  in good condition.  The rest of the procedure was dictated separately by Dr.  Eda Paschal.                                                Maretta Bees. Vonita Moss, M.D.    LJP/MEDQ  D:  06/03/2003  T:  06/03/2003  Job:  [id number]   cc:   Reuel Boom L. Eda Paschal, M.D.  [address]  [place]  [place] [address]  Fax: [phone]

## 2011-03-05 NOTE — Op Note (Signed)
[facility]  Patient:    Mallory Hardy, Mallory Hardy Visit Number: [id number] MRN: [id number]          Service Type: Attending:  Rande Brunt. Eda Paschal, M.D. Dictated by:   Rande Brunt. Eda Paschal, M.D. Proc. Date: 08/31/01                             Operative Report  PREOPERATIVE DIAGNOSIS:       Vaginal lesion.  POSTOPERATIVE DIAGNOSIS:      Vaginal lesion, probably chronic inflammation.  NAME OF OPERATION:            Multiple biopsies of the above.  SURGEON:                      Daniel L. Eda Paschal, M.D.  ANESTHESIA:                   General.  INDICATIONS:                  The patient is a 76 year old female who presented to her primary care physician with postcoital bleeding after hysterectomy.  He saw a lesion below her urethra associated with a urethral caruncle and sent her to Dr. Darvin Neighbours for evaluation of the above.  She underwent cystoscopy in his office.  Although he saw a urethral caruncle, he also thought this was a different vaginal lesion and was concerned that it was malignant and referred her to me for biopsy.  On examination in the office, she had a polypoid, erythematous, edematous lesion with not well defined borders which has not responded to estrogen cream.  She now enters the hospital for biopsy.  FINDINGS:  The patient had a polypoid lesion below the urethra that was swollen and edematous and without really distinct borders.  DESCRIPTION OF PROCEDURE:     After adequate general anesthesia, the patient was placed in the dorsal lithotomy position and prepped and draped in the usual sterile manner.  Using a knife and an Allis to elevate the lesion away from the urethra and the bladder, a fairly large section was removed and sent for frozen section.  Frozen section was a polypoid lesion, possibly even a polyp with significant chronic inflammation.  The bleeding in this area was controlled with a Bovie.  The patient was catheterized to  be sure that the urethra had not been compromised.  It had not and she was draining clear urine.  An additional biopsy was taken a little bit closer to the urethra because the lesion looked a little bit different in that area.  This area was also controlled with the Bovie.  An attempt was made to suture where the biopsies had been removed leaving denuded vaginal mucosa but because of the chronic inflammatory process, sutures could not be kept in and, therefore, at this point the procedure was terminated since there was no significant bleeding.  The patient tolerated the procedure well and left the operating room in satisfactory condition. Dictated by:   Rande Brunt. Eda Paschal, M.D. Attending:  Rande Brunt. Eda Paschal, M.D. DD:  08/31/01 TD:  08/31/01 Job: [id number]

## 2011-03-05 NOTE — Op Note (Signed)
NAME:  Mallory Hardy, Mallory Hardy              ACCOUNT NO.:  [id number]   MEDICAL RECORD NO.:  [id number]          PATIENT TYPE:  AMB   LOCATION:  DSC                          FACILITY:  [facility]   PHYSICIAN:  Katy Fitch. Sypher, M.D. 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   OPERATION:  1. Diagnostic arthroscopy right shoulder followed by arthroscopic      debridement of supraspinatus and infraspinatus rotator cuff tears and      limited synovectomy.  2. Open subacromial decompression with resection of ossified      coracoacromial ligament and acromioplasty with removal of large deposit      of calcific bursitis lateral to the acromion.  3. Open resection of distal clavicle.  4. Reconstruction of supraspinatus and infraspinatus rotator cuff tear      utilizing two bio-corkscrew anchors, one for the infraspinatus  and one      for the supraspinatus, two McLaughlin sutures, and one over-the-top      delay suture.   OPERATIONS:  Katy Fitch. Sypher, M.D.   ASSISTANT:  Marveen Reeks. Dasnoit, P.A.-C.   ANESTHESIA:  General by endotracheal technique supplemented by right  interscalene block.   SUPERVISING ANESTHESIOLOGIST:  Quita Skye. Krista Blue, M.D.   INDICATIONS:  Mallory Hardy is a 76 year old woman referred through the  courtesy of Dr. Cindee Salt and Dr. Luanna Salk for evaluation of a painful  right shoulder.  Mr. and Ms. Blomgren have been patient's of [facility] for many years.  Recently, Mallory Hardy presented Dr. Merlyn Lot  for evaluation of a painful right shoulder.  She was noted have signs of AC  arthropathy and impingement.  An MRI of the shoulder documented extensive  rotator cuff tendinopathy with a deep surface rotator cuff tear of the  supraspinatus, a probable full-thickness lateral tear of supraspinatus, and  extensive calcific bursitis.  Dr. Merlyn Lot referred her for an upper extremity  orthopedic consult anticipating rotator cuff repair.   Clinical examination in the office revealed signs of weak abduction, pain on  abduction and external rotation, and pain on cross torso adduction.  Her  plain films documented ossification of the coracoacromial ligament  consistent with a type 3 acromion and signs of chronic impingement.  We  recommended to Mallory Hardy to proceed with diagnostic arthroscopy,  subacromial decompression, resection of her calcific bursitis, resection of  the distal clavicle, followed by probable rotator cuff repair.  After  informed consent, she is brought to the operating room at this time.   PROCEDURE:  Mallory Hardy is brought to the operating room and placed  in  supine position on the operating room table.  Following an anesthesia  consult by Dr. Krista Blue, general sedation was provided followed by placement  of an interscalene block.  After approximately 15 minutes, she had excellent  anesthesia of the right forequarter.   Mallory Hardy was transferred to room 6 and placed in the supine position on  the operating room table and, under Dr. Robina Ade direct supervision, general  endotracheal anesthesia induced.  She was carefully positioned in the beach-  chair position with the aid of a torso and head holder for shoulder  arthroscopy followed by routine prep of the right upper extremity and  forequarter with DuraPrep.  Impervious arthroscopy drapes were applied.   The procedure commenced with placement of  arthroscope through a standard  posterior viewing portal.  Diagnostic arthroscopy revealed intact hyaline  and articular cartilage surfaces on the glenoid and humeral head.  There was  minor degenerative changes in the labrum.  These were debrided with a 4.5-mm  suction shaver brought in anteriorly.  The origin of the biceps was sound.  The anterior glenohumeral ligaments were normal.  The labrum was essentially  intact 360 degrees.  The biceps tendon was normal to the rotator interval.  The deep surface of the rotator cuff was inspected carefully and found to  have a full-thickness tear of the supraspinatus and a 75% deep surface tear  of the infraspinatus.  A suction shaver was used to debride the necrotic  tendon on the deep surface of the rotator cuff followed by removal the  arthroscopic equipment.   We then proceeded to open rotator cuff repair.  A 4 cm incision was  fashioned from the distal clavicle across the anterior acromion.  The  capsule of the [facility] joint was incised and the distal 15 mm clavicle exposed  subperiosteally.  An oscillating saw was used to remove the distal 15 mm of  clavicle.  The coracoacromial ligament was identified and the area of  ossified anterior acromion noted.  An oscillating saw was used to remove the  anterior acromion and to level the acromion to a type 1 morphology.  Rongeurs were used to remove the calcific bursitis noted laterally.  A hand  rasp was used to smooth the repair.  After proper bursectomy, the rotator  cuff predicament was easily visualized.  The margins of the cuff tear were  elevated exposing the bare greater tuberosity.  A power bur was used to  decorticate the footprint for the infraspinatus and supraspinatus followed  by placement two bio-corkscrew anchors, one insetting the infraspinatus and  one at the medial margin of the supraspinatus.  Two McLaughlin grasping style sutures were then used to advance the  infraspinatus and  supraspinatus to a proper lateral position, insetting the  margin of the repair.  An over-the-top suture was used to inset the anterior  supraspinatus using a push lock laterally.  The bio-corkscrew anchors were  used to place four mattress sutures medially to obtain a proper foot print  of the infraspinatus and supraspinatus repairs.  A very satisfactory low  profile repair was achieved.  There were no residual signs of impingement  noted.   The anterior third of the deltoid was then repaired to the capsule of the [facility]  joint and the periosteum of the acromion utilizing mattress sutures of #2  FiberWire with knots buried.  A 0 Vicryl was used to repair the deltoid  split laterally and to finish the capsular repair at  the [facility] joint.  The skin  was repaired with subdermal sutures of 3-0 Vicryl and intradermal 3-0  Prolene.  There were no apparent complications.  Mallory Hardy tolerated the  surgery and anesthesia well.  She was awakened from general anesthesia and  transferred to the recovery room with stable vital signs.   She will be discharged to the [facility] of [facility] for  observation of her vital signs 24 hours overnight.  We anticipate use of IV  and p.o. Dilaudid as well as IV Ancef 1 gram q.8h. x24 hours as prophylactic  antibiotic.      Katy Fitch Sypher, M.D.  Electronically Signed     RVS/MEDQ  D:  06/14/2006  T:  06/14/2006  Job:  [id number]   cc:   Cindee Salt, M.D.  Quita Skye Artis Flock, M.D.

## 2011-03-11 ENCOUNTER — Encounter: Payer: Self-pay | Admitting: Internal Medicine

## 2011-03-19 ENCOUNTER — Telehealth: Payer: Self-pay | Admitting: Internal Medicine

## 2011-03-19 MED ORDER — HYDROCHLOROTHIAZIDE 12.5 MG PO CAPS: 12.5000 mg | ORAL_CAPSULE | Freq: Every day | ORAL | Status: DC

## 2011-03-19 NOTE — Telephone Encounter (Signed)
Pt states that it has been a week since she contacted drug store about hydrochlorothiazide. Pharmacy told pt that we refilled other med but not the hydrochlothiazide 12.5 mg. [facility] on [address]. Pt states that we can leave message whether not approved or denied.

## 2011-03-19 NOTE — Telephone Encounter (Signed)
Rx refill sent to  Pharmacy. Call placed to patient at [phone], she was informed rx sent to pharmacy

## 2011-04-06 NOTE — Telephone Encounter (Signed)
No action required.

## 2011-05-26 ENCOUNTER — Encounter: Payer: Self-pay | Admitting: Family

## 2011-05-26 ENCOUNTER — Ambulatory Visit (INDEPENDENT_AMBULATORY_CARE_PROVIDER_SITE_OTHER): Payer: Medicare Other | Admitting: Family

## 2011-05-26 VITALS — BP 122/70 | HR 78 | Temp 97.8°F | Resp 16 | Ht 66.5 in | Wt 160.1 lb

## 2011-05-26 DIAGNOSIS — M25569 Pain in unspecified knee: Secondary | ICD-10-CM

## 2011-05-26 DIAGNOSIS — M25469 Effusion, unspecified knee: Secondary | ICD-10-CM

## 2011-05-26 DIAGNOSIS — M25461 Effusion, right knee: Secondary | ICD-10-CM

## 2011-05-26 DIAGNOSIS — M25561 Pain in right knee: Secondary | ICD-10-CM

## 2011-05-26 MED ORDER — MELOXICAM 7.5 MG PO TABS: 7.5000 mg | ORAL_TABLET | Freq: Every day | ORAL | Status: DC

## 2011-05-26 NOTE — Patient Instructions (Signed)
Please call if your knee pain/swelling worsens or if it is not improved in 2 weeks.

## 2011-05-26 NOTE — Progress Notes (Signed)
  Subjective:    Patient ID: Mallory Hardy, female    DOB: 02/11/1935, 76 y.o.   MRN: [id number]  HPI  Mallory Hardy is a 76 yr old female who presents today with chief complaint of right knee pain.  Started yesterday.  Denies fever, injury, or prior hx of knee problems. Tried ice with little improvement.    Review of Systems See HPI  Past Medical History  Diagnosis Date  . Urinary tract infection   . Thyroid disease     hypothyroidism  . Migraine headache   . Renal calculus   . Allergy     seasonal  . Anxiety   . Depression   . GERD (gastroesophageal reflux disease)   . Constipation, chronic   . Diverticulosis     History   Social History  . Marital Status: Widowed    Spouse Name: N/A    Number of Children: 2  . Years of Education: N/A   Occupational History  . Retired    Social History Main Topics  . Smoking status: Never Smoker   . Smokeless tobacco: Never Used  . Alcohol Use: No  . Drug Use: No  . Sexually Active: Not on file   Other Topics Concern  . Not on file   Social History Narrative  . No narrative on file    Past Surgical History  Procedure Date  . Thyroidectomy 1959    goiter  . Abdominal exploration surgery 1959  . Rotator cuff repair 2007  . Bilateral oophorectomy 2003  . Abdominal hysterectomy 1978    Family History  Problem Relation Age of Onset  . Cancer Neg Hx     No Known Allergies  Current Outpatient Prescriptions on File Prior to Visit  Medication Sig Dispense Refill  . aspirin 81 MG EC tablet Take 1 tablet 3 x weekly.      . Calcium Carbonate-Vitamin D (CALCIUM 600-D) 600-400 MG-UNIT per tablet Take 1 tablet by mouth 2 (two) times daily.        . diclofenac sodium (VOLTAREN) 1 % GEL Apply topically. Apply 3 times a day for 1 week as needed to the neck.       . hydrochlorothiazide (,MICROZIDE/HYDRODIURIL,) 12.5 MG capsule Take 1 capsule (12.5 mg total) by mouth daily.  90 capsule  1  . levothyroxine (SYNTHROID, LEVOTHROID)  50 MCG tablet Take 50 mcg by mouth daily.        Marland Kitchen loratadine (CLARITIN) 10 MG tablet Take 10 mg by mouth daily as needed.        . Multiple Vitamins-Minerals (MULTIVITAMIN WITH MINERALS) tablet Take 1 tablet by mouth daily.          BP 122/70  Pulse 78  Temp(Src) 97.8 F (36.6 C) (Oral)  Resp 16  Ht 5' 6.5" (1.689 m)  Wt 160 lb 1.9 oz (72.63 kg)  BMI 25.46 kg/m2       Objective:   Physical Exam  Constitutional: She appears well-developed and well-nourished. No distress.  Musculoskeletal:       Right knee with swelling surrounding the patella.  Tender above right patella. No warmth or erythema.  Psychiatric: She has a normal mood and affect. Her behavior is normal. Judgment and thought content normal.          Assessment & Plan:

## 2011-05-26 NOTE — Assessment & Plan Note (Signed)
Will plan to treat conservatively with NSAIDS and ice.  If symptoms worsen or if they are not improved in 2 weeks, plan referral to sports medicine for further evaluation.

## 2011-06-09 ENCOUNTER — Ambulatory Visit: Payer: Medicare Other | Admitting: Family

## 2011-07-07 ENCOUNTER — Encounter: Payer: Self-pay | Admitting: Family

## 2011-07-07 ENCOUNTER — Ambulatory Visit (INDEPENDENT_AMBULATORY_CARE_PROVIDER_SITE_OTHER): Payer: Medicare Other | Admitting: Family

## 2011-07-07 VITALS — BP 130/80 | HR 60 | Temp 97.8°F | Resp 16 | Ht 66.5 in | Wt 160.1 lb

## 2011-07-07 DIAGNOSIS — F341 Dysthymic disorder: Secondary | ICD-10-CM

## 2011-07-07 DIAGNOSIS — M25461 Effusion, right knee: Secondary | ICD-10-CM

## 2011-07-07 DIAGNOSIS — I10 Essential (primary) hypertension: Secondary | ICD-10-CM

## 2011-07-07 DIAGNOSIS — M25469 Effusion, unspecified knee: Secondary | ICD-10-CM

## 2011-07-07 DIAGNOSIS — Z23 Encounter for immunization: Secondary | ICD-10-CM

## 2011-07-07 DIAGNOSIS — E039 Hypothyroidism, unspecified: Secondary | ICD-10-CM

## 2011-07-07 LAB — BASIC METABOLIC PANEL
Calcium: 10.1 mg/dL (ref 8.4–10.5)
Creat: 0.9 mg/dL (ref 0.50–1.10)

## 2011-07-07 LAB — TSH: TSH: 1.537 u[IU]/mL (ref 0.350–4.500)

## 2011-07-07 NOTE — Assessment & Plan Note (Signed)
Will check TSH today, continue synthroid.

## 2011-07-07 NOTE — Assessment & Plan Note (Signed)
Deteriorated.  Will refer to Dr. Pearletha Forge- sports medicine for further eval.

## 2011-07-07 NOTE — Progress Notes (Signed)
Subjective:    Patient ID: Mallory Hardy, female    DOB: April 28, 1935, 76 y.o.   MRN: [id number]  HPI Ms.  Mallory Hardy is a 76 yr old female who presents today for follow up.  1) R knee pain- using ice and aleve, continues to swell during the day, worsened by walking.  Notes stiffness after prolonged sitting. Pain 8/10 last night 3/10 now. Unable to knee on right knee.   2) HTN- Denies CP or shortness of breath.  + leg cramps about 3 times a week.    3) Hypothyroid- She continues synthroid, denies heat/cold intolerance or weight loss.  4) Depression- notes occasional periods of sadness but feels able to deal with it and that this is pretty much resolved.   Review of Systems See HPI  Past Medical History  Diagnosis Date  . Urinary tract infection   . Thyroid disease     hypothyroidism  . Migraine headache   . Renal calculus   . Allergy     seasonal  . Anxiety   . Depression   . GERD (gastroesophageal reflux disease)   . Constipation, chronic   . Diverticulosis     History   Social History  . Marital Status: Widowed    Spouse Name: N/A    Number of Children: 2  . Years of Education: N/A   Occupational History  . Retired    Social History Main Topics  . Smoking status: Never Smoker   . Smokeless tobacco: Never Used  . Alcohol Use: No  . Drug Use: No  . Sexually Active: Not on file   Other Topics Concern  . Not on file   Social History Narrative  . No narrative on file    Past Surgical History  Procedure Date  . Thyroidectomy 1959    goiter  . Abdominal exploration surgery 1959  . Rotator cuff repair 2007  . Bilateral oophorectomy 2003  . Abdominal hysterectomy 1978    Family History  Problem Relation Age of Onset  . Cancer Neg Hx     No Known Allergies  Current Outpatient Prescriptions on File Prior to Visit  Medication Sig Dispense Refill  . aspirin 81 MG EC tablet Take 1 tablet 3 x weekly.      . Calcium Carbonate-Vitamin D (CALCIUM 600-D)  600-400 MG-UNIT per tablet Take 1 tablet by mouth 2 (two) times daily.        . Cholecalciferol (VITAMIN D) 2000 UNITS CAPS Take 1 capsule by mouth daily.        . diclofenac sodium (VOLTAREN) 1 % GEL Apply topically. Apply 3 times a day for 1 week as needed to the neck.       . hydrochlorothiazide (,MICROZIDE/HYDRODIURIL,) 12.5 MG capsule Take 1 capsule (12.5 mg total) by mouth daily.  90 capsule  1  . levothyroxine (SYNTHROID, LEVOTHROID) 50 MCG tablet Take 50 mcg by mouth daily.        Marland Kitchen loratadine (CLARITIN) 10 MG tablet Take 10 mg by mouth daily as needed.        . Multiple Vitamins-Minerals (MULTIVITAMIN WITH MINERALS) tablet Take 1 tablet by mouth daily.        . ranitidine (ZANTAC) 150 MG tablet Take 150 mg by mouth 2 (two) times daily.          BP 130/80  Pulse 60  Temp(Src) 97.8 F (36.6 C) (Oral)  Resp 16  Ht 5' 6.5" (1.689 m)  Wt 160 lb 1.3 oz (72.612  kg)  BMI 25.45 kg/m2       Objective:   Physical Exam  Constitutional: She is oriented to person, place, and time. She appears well-developed and well-nourished.  Cardiovascular: Normal rate and regular rhythm.   Pulmonary/Chest: Effort normal and breath sounds normal.  Musculoskeletal:       + swelling noted of the right knee. No erythema. Unable to fully flex right knee.   Neurological: She is alert and oriented to person, place, and time.  Psychiatric: She has a normal mood and affect. Her behavior is normal. Judgment and thought content normal.          Assessment & Plan:

## 2011-07-07 NOTE — Assessment & Plan Note (Signed)
This is stable 

## 2011-07-07 NOTE — Patient Instructions (Signed)
Please complete your blood work on the first floor.  You will be contacted about your referral to Dr. Pearletha Forge. Follow up in 3 months.

## 2011-07-07 NOTE — Assessment & Plan Note (Signed)
BP Readings from Last 3 Encounters:  07/07/11 130/80  05/26/11 122/70  01/15/11 132/72   BP is stable on HCTZ, check BMET.

## 2011-07-08 ENCOUNTER — Encounter: Payer: Self-pay | Admitting: Family Medicine

## 2011-07-08 ENCOUNTER — Ambulatory Visit (INDEPENDENT_AMBULATORY_CARE_PROVIDER_SITE_OTHER): Payer: Medicare Other | Admitting: Family Medicine

## 2011-07-08 ENCOUNTER — Ambulatory Visit (HOSPITAL_BASED_OUTPATIENT_CLINIC_OR_DEPARTMENT_OTHER)
Admission: RE | Admit: 2011-07-08 | Discharge: 2011-07-08 | Disposition: A | Discharge status: Home / Self Care | Payer: Medicare Other | Source: Ambulatory Visit | Attending: Family Medicine | Admitting: Family Medicine

## 2011-07-08 VITALS — BP 124/80 | HR 72 | Temp 97.6°F | Ht 66.0 in | Wt 162.2 lb

## 2011-07-08 DIAGNOSIS — M25569 Pain in unspecified knee: Secondary | ICD-10-CM | POA: Insufficient documentation

## 2011-07-08 DIAGNOSIS — M25561 Pain in right knee: Secondary | ICD-10-CM

## 2011-07-08 DIAGNOSIS — M1710 Unilateral primary osteoarthritis, unspecified knee: Secondary | ICD-10-CM | POA: Insufficient documentation

## 2011-07-08 MED ORDER — DICLOFENAC POTASSIUM 50 MG PO TABS: 50.0000 mg | ORAL_TABLET | Freq: Two times a day (BID) | ORAL | Status: DC

## 2011-07-08 NOTE — Progress Notes (Signed)
Subjective:    Patient ID: Mallory Hardy, female    DOB: 24-Apr-1935, 76 y.o.   MRN: [id number]  PCP: Dr. Artist Pais  HPI 76 yo F here with right knee pain x 8 weeks.  Patient denies any known injury. States over past 8 weeks has had slowly worsening medial > lateral right knee pain worse at end of day and with activity. Associated with knee swelling but no bruising. No catching or locking. No remote injury. No giving out. Has been icing, meloxicam also helped some. Relative rest also helped. No left knee pain.  Past Medical History  Diagnosis Date  . Urinary tract infection   . Thyroid disease     hypothyroidism  . Migraine headache   . Renal calculus   . Allergy     seasonal  . Anxiety   . Depression   . GERD (gastroesophageal reflux disease)   . Constipation, chronic   . Diverticulosis   . Hypertension     Current Outpatient Prescriptions on File Prior to Visit  Medication Sig Dispense Refill  . aspirin 81 MG EC tablet Take 1 tablet 3 x weekly.      . Calcium Carbonate-Vitamin D (CALCIUM 600-D) 600-400 MG-UNIT per tablet Take 1 tablet by mouth 2 (two) times daily.        . Cholecalciferol (VITAMIN D) 2000 UNITS CAPS Take 1 capsule by mouth daily.        . diclofenac sodium (VOLTAREN) 1 % GEL Apply topically. Apply 3 times a day for 1 week as needed to the neck.       . diphenhydrAMINE (BENADRYL) 25 MG tablet Take 25 mg by mouth at bedtime as needed. Allergies        . hydrochlorothiazide (,MICROZIDE/HYDRODIURIL,) 12.5 MG capsule Take 1 capsule (12.5 mg total) by mouth daily.  90 capsule  1  . levothyroxine (SYNTHROID, LEVOTHROID) 50 MCG tablet Take 50 mcg by mouth daily.        Marland Kitchen loratadine (CLARITIN) 10 MG tablet Take 10 mg by mouth daily as needed.        . Multiple Vitamins-Minerals (MULTIVITAMIN WITH MINERALS) tablet Take 1 tablet by mouth daily.        . ranitidine (ZANTAC) 150 MG tablet Take 150 mg by mouth 2 (two) times daily.          Past Surgical History    Procedure Date  . Thyroidectomy 1959    goiter  . Abdominal exploration surgery 1959  . Rotator cuff repair 2007  . Bilateral oophorectomy 2003  . Abdominal hysterectomy 1978    No Known Allergies  History   Social History  . Marital Status: Widowed    Spouse Name: N/A    Number of Children: 2  . Years of Education: N/A   Occupational History  . Retired    Social History Main Topics  . Smoking status: Never Smoker   . Smokeless tobacco: Never Used  . Alcohol Use: No  . Drug Use: No  . Sexually Active: Not on file   Other Topics Concern  . Not on file   Social History Narrative  . No narrative on file    Family History  Problem Relation Age of Onset  . Cancer Neg Hx   . Diabetes Neg Hx   . Heart attack Neg Hx   . Hyperlipidemia Neg Hx   . Sudden death Neg Hx   . Hypertension Mother     BP 124/80  Pulse 72  Temp(Src) 97.6 F (36.4 C) (Oral)  Ht 5\' 6"  (1.676 m)  Wt 162 lb 3.2 oz (73.573 kg)  BMI 26.18 kg/m2  Review of Systems See HPI above.    Objective:   Physical Exam Gen: NAD  R knee: No gross deformity, ecchymoses, swelling.  1+ crepitation. Mild medial > lateral joint line TTP.  No patellar tendon or pes TTP.  Minimal posterior patellar facet TTP. FROM. Negative ant/post drawers. Negative valgus/varus testing. Negative lachmanns. Negative mcmurrays, apleys, patellar apprehension, clarkes. NV intact distally.    Assessment & Plan:  1. Right knee pain - 2/2 mild DJD on radiographs vs degenerative meniscal tear.  Both treated similarly initially.  She would like to start with conservative treatment - tylenol regularly, nsaids as needed (she will try voltaren gel OR orally for this - advised not to use both), glucosamine.  Shown home quad strengthening exercises (straight leg raises, leg extensions, 45 degree squats) to do daily.  Icing as needed.  F/u if not improving and would like to move forward with PT, injection, or other medications.

## 2011-07-08 NOTE — Patient Instructions (Addendum)
Your exam and x-rays are consistent with mild arthritis OR a degenerative meniscus tear of your knee Both are treated similarly initially. Take tylenol 500mg  1-2 tabs three times a day for pain every day. Use the voltaren gel OR the other rub you have 3-4 times a day for pain. If this isn't working well enough, stop the rubs and fill the voltaren pills instead. Glucosamine sulfate 750mg  twice a day is a supplement that has been shown to help moderate to severe arthritis. Cortisone injections are an option. If cortisone injections do not help, there are different types of shots that may help but they take longer to take effect. It's important that you continue to stay active. Consider physical therapy to strengthen muscles around the joint that hurts to take pressure off of the joint itself. Heat or ice 15 minutes at a time 3-4 times a day as needed to help with pain. Water aerobics and cycling with low resistance are the best two types of exercise for arthritis. For swelling use ice, an ACE wrap, and elevation at the end of the day. Follow up with me as needed or if you would like to move forward with any of the other options we discussed.

## 2011-07-08 NOTE — Assessment & Plan Note (Signed)
2/2 mild DJD on radiographs vs degenerative meniscal tear.  Both treated similarly initially.  She would like to start with conservative treatment - tylenol regularly, nsaids as needed (she will try voltaren gel OR orally for this - advised not to use both), glucosamine.  Shown home quad strengthening exercises (straight leg raises, leg extensions, 45 degree squats) to do daily.  Icing as needed.  F/u if not improving and would like to move forward with PT, injection, or other medications.

## 2011-07-09 ENCOUNTER — Encounter: Payer: Self-pay | Admitting: Family

## 2011-07-14 ENCOUNTER — Ambulatory Visit: Payer: Medicare Other | Admitting: Internal Medicine

## 2011-07-14 ENCOUNTER — Ambulatory Visit: Payer: Medicare Other | Admitting: Family

## 2011-09-20 ENCOUNTER — Encounter: Payer: Self-pay | Admitting: Obstetrics and Gynecology

## 2011-09-21 ENCOUNTER — Other Ambulatory Visit: Payer: Self-pay | Admitting: Internal Medicine

## 2011-09-22 ENCOUNTER — Telehealth: Payer: Self-pay | Admitting: Family

## 2011-09-22 MED ORDER — HYDROCHLOROTHIAZIDE 12.5 MG PO CAPS: 12.5000 mg | ORAL_CAPSULE | Freq: Every day | ORAL | Status: DC

## 2011-09-22 NOTE — Telephone Encounter (Signed)
Refill- hydrochlorot 12.5mg  cap. Take one capsule by mouth every day. Qty 90 last fill 8.24.12

## 2011-09-22 NOTE — Telephone Encounter (Signed)
HCTZ refill sent to [facility] for #90 x no refills.

## 2011-10-05 ENCOUNTER — Ambulatory Visit: Payer: Medicare Other | Admitting: Family

## 2011-10-06 ENCOUNTER — Ambulatory Visit: Payer: Medicare Other | Admitting: Family

## 2011-10-14 ENCOUNTER — Telehealth: Payer: Self-pay | Admitting: Family

## 2011-10-14 ENCOUNTER — Ambulatory Visit (INDEPENDENT_AMBULATORY_CARE_PROVIDER_SITE_OTHER): Payer: Medicare Other | Admitting: Internal Medicine

## 2011-10-14 ENCOUNTER — Encounter: Payer: Self-pay | Admitting: Internal Medicine

## 2011-10-14 DIAGNOSIS — E039 Hypothyroidism, unspecified: Secondary | ICD-10-CM

## 2011-10-14 DIAGNOSIS — Z79899 Other long term (current) drug therapy: Secondary | ICD-10-CM

## 2011-10-14 DIAGNOSIS — I10 Essential (primary) hypertension: Secondary | ICD-10-CM

## 2011-10-14 MED ORDER — HYDROCHLOROTHIAZIDE 12.5 MG PO CAPS: 12.5000 mg | ORAL_CAPSULE | Freq: Every day | ORAL | Status: DC

## 2011-10-14 NOTE — Assessment & Plan Note (Signed)
Stable. Continue current dosing. Obtain tsh/free t4 prior to next visit

## 2011-10-14 NOTE — Assessment & Plan Note (Addendum)
Normotensive and stable. Continue current regimen. Monitor bp as outpt and followup in clinic as scheduled. Obtain chem7 prior to next visit 

## 2011-10-14 NOTE — Telephone Encounter (Signed)
Lab orders entered for May 2013

## 2011-10-14 NOTE — Patient Instructions (Signed)
Please schedule chem7 (v58.69) and tsh/free t4 (hypothyroidism) prior to next visit

## 2011-10-14 NOTE — Progress Notes (Signed)
  Subjective:    Patient ID: Nicholaus Corolla, female    DOB: 08-05-1935, 76 y.o.   MRN: [id number]  HPI Pt presents to clinic for followup of multiple medical problems. BP reviewed as normotensive. H/o mildly elevated cholesterol and believes had outside lab check summer 2012. Tetanus believed to be within 5 years. Right knee pain is improved with only intermittent mild discomfort. H/o hypothyroidism and reviewed nl tsh 9/12. No other complaints.   Past Medical History  Diagnosis Date  . Urinary tract infection   . Thyroid disease     hypothyroidism  . Migraine headache   . Renal calculus   . Allergy     seasonal  . Anxiety   . Depression   . GERD (gastroesophageal reflux disease)   . Constipation, chronic   . Diverticulosis   . Hypertension    Past Surgical History  Procedure Date  . Thyroidectomy 1959    goiter  . Abdominal exploration surgery 1959  . Rotator cuff repair 2007  . Bilateral oophorectomy 2003  . Abdominal hysterectomy 1978    reports that she has never smoked. She has never used smokeless tobacco. She reports that she does not drink alcohol or use illicit drugs. family history includes Hypertension in her mother.  There is no history of Cancer, and Diabetes, and Heart attack, and Hyperlipidemia, and Sudden death, . No Known Allergies    Review of Systems see hpi     Objective:   Physical Exam  Physical Exam  Nursing note and vitals reviewed. Constitutional: Appears well-developed and well-nourished. No distress.  HENT:  Head: Normocephalic and atraumatic.  Right Ear: External ear normal.  Left Ear: External ear normal.  Eyes: Conjunctivae are normal. No scleral icterus.  Neck: Neck supple. Carotid bruit is not present.  Cardiovascular: Normal rate, regular rhythm and normal heart sounds.  Exam reveals no gallop and no friction rub.   No murmur heard. Pulmonary/Chest: Effort normal and breath sounds normal. No respiratory distress. He has no  wheezes. no rales.  Lymphadenopathy:    He has no cervical adenopathy.  Neurological:Alert.  Skin: Skin is warm and dry. Not diaphoretic.  Psychiatric: Has a normal mood and affect.        Assessment & Plan:

## 2012-01-11 ENCOUNTER — Telehealth: Payer: Self-pay | Admitting: Family

## 2012-01-11 NOTE — Telephone Encounter (Signed)
Patient is requesting a copy of her flu shot record faxed to her.   Fax: [phone]

## 2012-01-12 NOTE — Telephone Encounter (Signed)
Immunization record faxed to the number below and pt has been notified.

## 2012-03-10 ENCOUNTER — Other Ambulatory Visit: Payer: Self-pay | Admitting: *Deleted

## 2012-03-10 DIAGNOSIS — Z79899 Other long term (current) drug therapy: Secondary | ICD-10-CM

## 2012-03-10 DIAGNOSIS — E039 Hypothyroidism, unspecified: Secondary | ICD-10-CM

## 2012-03-10 LAB — BASIC METABOLIC PANEL
BUN: 22 mg/dL (ref 6–23)
Potassium: 4.4 mEq/L (ref 3.5–5.3)
Sodium: 141 mEq/L (ref 135–145)

## 2012-03-10 LAB — TSH: TSH: 1.196 u[IU]/mL (ref 0.350–4.500)

## 2012-03-10 LAB — T4, FREE: Free T4: 1.04 ng/dL (ref 0.80–1.80)

## 2012-03-15 ENCOUNTER — Ambulatory Visit: Payer: Medicare Other | Admitting: Internal Medicine

## 2012-03-17 ENCOUNTER — Encounter: Payer: Self-pay | Admitting: Internal Medicine

## 2012-03-17 ENCOUNTER — Ambulatory Visit (INDEPENDENT_AMBULATORY_CARE_PROVIDER_SITE_OTHER): Payer: Medicare Other | Admitting: Internal Medicine

## 2012-03-17 ENCOUNTER — Telehealth: Payer: Self-pay | Admitting: Family

## 2012-03-17 VITALS — BP 118/80 | HR 75 | Temp 97.9°F | Resp 18 | Ht 66.5 in | Wt 165.0 lb

## 2012-03-17 DIAGNOSIS — I10 Essential (primary) hypertension: Secondary | ICD-10-CM

## 2012-03-17 DIAGNOSIS — R7309 Other abnormal glucose: Secondary | ICD-10-CM

## 2012-03-17 DIAGNOSIS — E785 Hyperlipidemia, unspecified: Secondary | ICD-10-CM

## 2012-03-17 DIAGNOSIS — E039 Hypothyroidism, unspecified: Secondary | ICD-10-CM

## 2012-03-17 DIAGNOSIS — Z79899 Other long term (current) drug therapy: Secondary | ICD-10-CM

## 2012-03-17 MED ORDER — LEVOTHYROXINE SODIUM 50 MCG PO TABS: 50.0000 ug | ORAL_TABLET | Freq: Every day | ORAL | Status: DC

## 2012-03-17 NOTE — Assessment & Plan Note (Signed)
Isolated elevations with outside monitoring. Recommend outpt bp log to be reviewed. Continue current regimen pending results.

## 2012-03-17 NOTE — Assessment & Plan Note (Signed)
Stable. Asx. Continue current dosing. 

## 2012-03-17 NOTE — Assessment & Plan Note (Signed)
Recommend low sugar/carb diet and regular exercise. Obtain chem7 and a1c prior to next visit 

## 2012-03-17 NOTE — Progress Notes (Signed)
  Subjective:    Patient ID: Mallory Hardy, female    DOB: 12-26-1934, 77 y.o.   MRN: [id number]  HPI Pt presents to clinic for followup of multiple medical problems. Reviewed mildly elevated glucose. No personal h/o dm. States does ingest sugars regularly. bp normotensive but notes a few recent mild elevations. One may have occurred in the setting of a headache. Wt stable. Tsh/free t4 reviewed nl.   Past Medical History  Diagnosis Date  . Urinary tract infection   . Thyroid disease     hypothyroidism  . Migraine headache   . Renal calculus   . Allergy     seasonal  . Anxiety   . Depression   . GERD (gastroesophageal reflux disease)   . Constipation, chronic   . Diverticulosis   . Hypertension    Past Surgical History  Procedure Date  . Thyroidectomy 1959    goiter  . Abdominal exploration surgery 1959  . Rotator cuff repair 2007  . Bilateral oophorectomy 2003  . Abdominal hysterectomy 1978    reports that she has never smoked. She has never used smokeless tobacco. She reports that she does not drink alcohol or use illicit drugs. family history includes Hypertension in her mother.  There is no history of Cancer, and Diabetes, and Heart attack, and Hyperlipidemia, and Sudden death, . No Known Allergies    Review of Systems see hpi     Objective:   Physical Exam  Physical Exam  Nursing note and vitals reviewed. Constitutional: Appears well-developed and well-nourished. No distress.  HENT:  Head: Normocephalic and atraumatic.  Right Ear: External ear normal.  Left Ear: External ear normal.  Eyes: Conjunctivae are normal. No scleral icterus.  Neck: Neck supple. Carotid bruit is not present.  Cardiovascular: Normal rate, regular rhythm and normal heart sounds.  Exam reveals no gallop and no friction rub.   No murmur heard. Pulmonary/Chest: Effort normal and breath sounds normal. No respiratory distress. He has no wheezes. no rales.  Lymphadenopathy:    He has no  cervical adenopathy.  Neurological:Alert.  Skin: Skin is warm and dry. Not diaphoretic.  Psychiatric: Has a normal mood and affect.        Assessment & Plan:

## 2012-03-17 NOTE — Patient Instructions (Addendum)
Please schedule fasting labs prior to next visit Cbc-401.9, chem7-v58.69, lipid/lft-272.4, a1c-hyperglycemia

## 2012-03-21 NOTE — Telephone Encounter (Signed)
Lab orders entered for October 2013. 

## 2012-08-09 LAB — LIPID PANEL
Cholesterol: 196 mg/dL (ref 0–200)
Total CHOL/HDL Ratio: 3.4 Ratio
VLDL: 13 mg/dL (ref 0–40)

## 2012-08-09 LAB — HEPATIC FUNCTION PANEL
ALT: 22 U/L (ref 0–35)
Albumin: 4.3 g/dL (ref 3.5–5.2)
Bilirubin, Direct: 0.1 mg/dL (ref 0.0–0.3)
Total Protein: 6.9 g/dL (ref 6.0–8.3)

## 2012-08-09 LAB — CBC
Hemoglobin: 12.9 g/dL (ref 12.0–15.0)
MCHC: 35 g/dL (ref 30.0–36.0)
RBC: 4.1 MIL/uL (ref 3.87–5.11)

## 2012-08-09 LAB — BASIC METABOLIC PANEL
Glucose, Bld: 94 mg/dL (ref 70–99)
Potassium: 4.1 mEq/L (ref 3.5–5.3)
Sodium: 142 mEq/L (ref 135–145)

## 2012-08-09 NOTE — Addendum Note (Signed)
Addended by: Regis Bill on: 08/09/2012 11:13 AM   Modules accepted: Orders

## 2012-08-09 NOTE — Telephone Encounter (Signed)
Lab orders released/SLS 

## 2012-08-17 ENCOUNTER — Telehealth: Payer: Self-pay | Admitting: Internal Medicine

## 2012-08-17 ENCOUNTER — Encounter: Payer: Self-pay | Admitting: Internal Medicine

## 2012-08-17 ENCOUNTER — Ambulatory Visit (INDEPENDENT_AMBULATORY_CARE_PROVIDER_SITE_OTHER): Payer: Medicare Other | Admitting: Internal Medicine

## 2012-08-17 VITALS — BP 128/82 | HR 70 | Temp 98.1°F | Resp 14 | Ht 66.5 in | Wt 166.0 lb

## 2012-08-17 DIAGNOSIS — E039 Hypothyroidism, unspecified: Secondary | ICD-10-CM

## 2012-08-17 DIAGNOSIS — I10 Essential (primary) hypertension: Secondary | ICD-10-CM

## 2012-08-17 DIAGNOSIS — R7309 Other abnormal glucose: Secondary | ICD-10-CM

## 2012-08-17 DIAGNOSIS — Z23 Encounter for immunization: Secondary | ICD-10-CM

## 2012-08-17 MED ORDER — LEVOTHYROXINE SODIUM 50 MCG PO TABS: 50.0000 ug | ORAL_TABLET | Freq: Every day | ORAL | Status: DC

## 2012-08-17 MED ORDER — HYDROCHLOROTHIAZIDE 12.5 MG PO CAPS: 12.5000 mg | ORAL_CAPSULE | Freq: Every day | ORAL | Status: DC

## 2012-08-17 NOTE — Patient Instructions (Signed)
Please schedule labs prior to next visit chem7-v58.69, a1c-hyperglycemia and tsh/free t4-hypothyroidism

## 2012-08-17 NOTE — Telephone Encounter (Signed)
Please schedule labs prior to next visit  chem7-v58.69, a1c-hyperglycemia and tsh/free t4-hypothyroidism  Patient has upcoming appointment at the end of February/2014. She will be going to [facility] lab

## 2012-08-20 NOTE — Assessment & Plan Note (Signed)
Stable and mild. Recommend low sugar carbohydrate diet and exercise.

## 2012-08-20 NOTE — Assessment & Plan Note (Signed)
Stable. Obtain TSH and free T4 with next visit.

## 2012-08-20 NOTE — Progress Notes (Signed)
  Subjective:    Patient ID: Mallory Hardy, female    DOB: 15-Jun-1935, 77 y.o.   MRN: [id number]  HPI Pt presents to clinic for followup of multiple medical problems. Blood pressure viewed as normotensive. Weight stable. History of hyperglycemia with mildly elevated A1c. LDL cholesterol improved. No active complaints.  Past Medical History  Diagnosis Date  . Urinary tract infection   . Thyroid disease     hypothyroidism  . Migraine headache   . Renal calculus   . Allergy     seasonal  . Anxiety   . Depression   . GERD (gastroesophageal reflux disease)   . Constipation, chronic   . Diverticulosis   . Hypertension    Past Surgical History  Procedure Date  . Thyroidectomy 1959    goiter  . Abdominal exploration surgery 1959  . Rotator cuff repair 2007  . Bilateral oophorectomy 2003  . Abdominal hysterectomy 1978    reports that she has never smoked. She has never used smokeless tobacco. She reports that she does not drink alcohol or use illicit drugs. family history includes Hypertension in her mother.  There is no history of Cancer, and Diabetes, and Heart attack, and Hyperlipidemia, and Sudden death, . No Known Allergies   Review of Systems see hpi     Objective:   Physical Exam  Physical Exam  Nursing note and vitals reviewed. Constitutional: Appears well-developed and well-nourished. No distress.  HENT:  Head: Normocephalic and atraumatic.  Right Ear: External ear normal.  Left Ear: External ear normal.  Eyes: Conjunctivae are normal. No scleral icterus.  Neck: Neck supple. Carotid bruit is not present.  Cardiovascular: Normal rate, regular rhythm and normal heart sounds.  Exam reveals no gallop and no friction rub.   No murmur heard. Pulmonary/Chest: Effort normal and breath sounds normal. No respiratory distress. He has no wheezes. no rales.  Lymphadenopathy:    He has no cervical adenopathy.  Neurological:Alert.  Skin: Skin is warm and dry. Not  diaphoretic.  Psychiatric: Has a normal mood and affect.        Assessment & Plan:

## 2012-08-20 NOTE — Assessment & Plan Note (Signed)
Normotensive and stable. Continue current regimen. Monitor bp as outpt and followup in clinic as scheduled.  

## 2012-10-04 ENCOUNTER — Encounter: Payer: Self-pay | Admitting: Internal Medicine

## 2012-12-04 ENCOUNTER — Telehealth: Payer: Self-pay

## 2012-12-04 DIAGNOSIS — Z79899 Other long term (current) drug therapy: Secondary | ICD-10-CM

## 2012-12-04 DIAGNOSIS — R7309 Other abnormal glucose: Secondary | ICD-10-CM

## 2012-12-04 DIAGNOSIS — E039 Hypothyroidism, unspecified: Secondary | ICD-10-CM

## 2012-12-04 NOTE — Telephone Encounter (Signed)
Labs ordered.

## 2012-12-05 LAB — HEMOGLOBIN A1C
Hgb A1c MFr Bld: 6.2 % — ABNORMAL HIGH (ref <5.7)
Mean Plasma Glucose: 131 mg/dL — ABNORMAL HIGH (ref <117)

## 2012-12-05 LAB — BASIC METABOLIC PANEL
BUN: 25 mg/dL — ABNORMAL HIGH (ref 6–23)
Chloride: 102 mEq/L (ref 96–112)
Creat: 0.95 mg/dL (ref 0.50–1.10)
Glucose, Bld: 105 mg/dL — ABNORMAL HIGH (ref 70–99)
Potassium: 4.2 mEq/L (ref 3.5–5.3)

## 2012-12-05 LAB — T4, FREE: Free T4: 1.12 ng/dL (ref 0.80–1.80)

## 2012-12-13 ENCOUNTER — Ambulatory Visit (INDEPENDENT_AMBULATORY_CARE_PROVIDER_SITE_OTHER): Payer: Medicare Other | Admitting: Family

## 2012-12-13 ENCOUNTER — Encounter: Payer: Self-pay | Admitting: Family

## 2012-12-13 VITALS — BP 130/80 | HR 67 | Temp 98.1°F | Resp 16 | Ht 66.5 in | Wt 169.1 lb

## 2012-12-13 DIAGNOSIS — I10 Essential (primary) hypertension: Secondary | ICD-10-CM

## 2012-12-13 DIAGNOSIS — R7309 Other abnormal glucose: Secondary | ICD-10-CM

## 2012-12-13 DIAGNOSIS — E039 Hypothyroidism, unspecified: Secondary | ICD-10-CM

## 2012-12-13 MED ORDER — HYDROCHLOROTHIAZIDE 12.5 MG PO CAPS: 12.5000 mg | ORAL_CAPSULE | Freq: Every day | ORAL | Status: DC

## 2012-12-13 NOTE — Progress Notes (Signed)
Subjective:    Patient ID: Mallory Hardy, female    DOB: 03/15/1935, 78 y.o.   MRN: [id number]  HPI  [name] is a 78 yr old female who presents today for follow up.  1) HTN- She is currently maintained on hctz 12.5 mg daily.  2) Hypothyroid-TSH normal at 1.015.    3) Hyperglycemia- recent A1C was 6.2.    Review of Systems  Cardiovascular: Negative for chest pain and leg swelling.   Past Medical History  Diagnosis Date  . Urinary tract infection   . Thyroid disease     hypothyroidism  . Migraine headache   . Renal calculus   . Allergy     seasonal  . Anxiety   . Depression   . GERD (gastroesophageal reflux disease)   . Constipation, chronic   . Diverticulosis   . Hypertension     History   Social History  . Marital Status: Widowed    Spouse Name: N/A    Number of Children: 2  . Years of Education: N/A   Occupational History  . Retired    Social History Main Topics  . Smoking status: Never Smoker   . Smokeless tobacco: Never Used  . Alcohol Use: No  . Drug Use: No  . Sexually Active: Not on file   Other Topics Concern  . Not on file   Social History Narrative  . No narrative on file    Past Surgical History  Procedure Laterality Date  . Thyroidectomy  1959    goiter  . Abdominal exploration surgery  1959  . Rotator cuff repair  2007  . Bilateral oophorectomy  2003  . Abdominal hysterectomy  1978    Family History  Problem Relation Age of Onset  . Cancer Neg Hx   . Diabetes Neg Hx   . Heart attack Neg Hx   . Hyperlipidemia Neg Hx   . Sudden death Neg Hx   . Hypertension Mother     No Known Allergies  Current Outpatient Prescriptions on File Prior to Visit  Medication Sig Dispense Refill  . aspirin 81 MG EC tablet Take 1 tablet 3 x weekly.      Marland Kitchen b complex vitamins tablet Take 1 tablet by mouth daily.        . Calcium Carbonate-Vitamin D (CALCIUM 600-D) 600-400 MG-UNIT per tablet Take 1 tablet by mouth 2 (two) times daily.         . diclofenac sodium (VOLTAREN) 1 % GEL Apply topically. Apply 3 times a day for 1 week as needed to the neck.       . diphenhydrAMINE (BENADRYL) 25 MG tablet Take 25 mg by mouth at bedtime as needed. Allergies        . Glucosamine HCl-Glucosamin SO4 (GLUCOSAMINE COMPLEX PO) Take 2,000 mg by mouth daily.        Marland Kitchen lactobacillus acidophilus (BACID) TABS Take 1 tablet by mouth daily. Colon Health Probiotic.      Marland Kitchen levothyroxine (SYNTHROID, LEVOTHROID) 50 MCG tablet Take 1 tablet (50 mcg total) by mouth daily.  90 tablet  1  . Multiple Vitamins-Minerals (MULTIVITAMIN WITH MINERALS) tablet Take 1 tablet by mouth daily.        . Multiple Vitamins-Minerals (OCUVITE PO) Take by mouth.      . naproxen sodium (ANAPROX) 220 MG tablet Take 220 mg by mouth as needed. Aleve.      . ranitidine (ZANTAC) 150 MG tablet Take 150 mg by mouth 2 (  two) times daily.         No current facility-administered medications on file prior to visit.    BP 130/80  Pulse 67  Temp(Src) 98.1 F (36.7 C) (Oral)  Resp 16  Ht 5' 6.5" (1.689 m)  Wt 169 lb 1.9 oz (76.712 kg)  BMI 26.89 kg/m2  SpO2 99%        Objective:   Physical Exam  Constitutional: She is oriented to person, place, and time. She appears well-developed and well-nourished. No distress.  Cardiovascular: Normal rate and regular rhythm.   No murmur heard. Pulmonary/Chest: Effort normal and breath sounds normal. No respiratory distress. She has no wheezes. She has no rales. She exhibits no tenderness.  Musculoskeletal: She exhibits no edema.  Lymphadenopathy:    She has no cervical adenopathy.  Neurological: She is alert and oriented to person, place, and time.  Skin: Skin is warm and dry.  Psychiatric: She has a normal mood and affect. Her behavior is normal. Judgment and thought content normal.          Assessment & Plan:

## 2012-12-13 NOTE — Assessment & Plan Note (Signed)
BP stable on hctz, continue same.  

## 2012-12-13 NOTE — Assessment & Plan Note (Signed)
A1C stable.  We discussed diabetic diet and exercise.

## 2012-12-13 NOTE — Patient Instructions (Addendum)
Please schedule a follow up appointment in 3 months.

## 2012-12-13 NOTE — Assessment & Plan Note (Signed)
Stable, continue current dose of levothyroxine

## 2013-02-19 ENCOUNTER — Other Ambulatory Visit: Payer: Self-pay | Admitting: Dermatology

## 2013-03-07 ENCOUNTER — Ambulatory Visit (INDEPENDENT_AMBULATORY_CARE_PROVIDER_SITE_OTHER): Payer: Medicare Other | Admitting: Family

## 2013-03-07 ENCOUNTER — Telehealth: Payer: Self-pay | Admitting: Family

## 2013-03-07 ENCOUNTER — Encounter: Payer: Self-pay | Admitting: Family

## 2013-03-07 VITALS — BP 138/88 | HR 70 | Temp 97.9°F | Resp 16 | Ht 66.5 in | Wt 169.0 lb

## 2013-03-07 DIAGNOSIS — E039 Hypothyroidism, unspecified: Secondary | ICD-10-CM

## 2013-03-07 DIAGNOSIS — R7309 Other abnormal glucose: Secondary | ICD-10-CM

## 2013-03-07 DIAGNOSIS — R739 Hyperglycemia, unspecified: Secondary | ICD-10-CM

## 2013-03-07 DIAGNOSIS — F341 Dysthymic disorder: Secondary | ICD-10-CM

## 2013-03-07 DIAGNOSIS — I10 Essential (primary) hypertension: Secondary | ICD-10-CM

## 2013-03-07 DIAGNOSIS — E785 Hyperlipidemia, unspecified: Secondary | ICD-10-CM

## 2013-03-07 LAB — BASIC METABOLIC PANEL
BUN: 22 mg/dL (ref 6–23)
CO2: 30 mEq/L (ref 19–32)
Calcium: 10.7 mg/dL — ABNORMAL HIGH (ref 8.4–10.5)
Chloride: 104 mEq/L (ref 96–112)
Creat: 0.86 mg/dL (ref 0.50–1.10)

## 2013-03-07 LAB — HEMOGLOBIN A1C: Mean Plasma Glucose: 117 mg/dL — ABNORMAL HIGH (ref <117)

## 2013-03-07 MED ORDER — HYDROCHLOROTHIAZIDE 12.5 MG PO CAPS: 12.5000 mg | ORAL_CAPSULE | Freq: Every day | ORAL | Status: DC

## 2013-03-07 MED ORDER — LEVOTHYROXINE SODIUM 50 MCG PO TABS: 50.0000 ug | ORAL_TABLET | Freq: Every day | ORAL | Status: DC

## 2013-03-07 NOTE — Patient Instructions (Addendum)
Please complete lab work prior to leaving. Complete lab work 1 week prior to next appointment in 3 months for medicare wellness visit.

## 2013-03-07 NOTE — Progress Notes (Signed)
Subjective:    Patient ID: Mallory Hardy, female    DOB: [birth year], 78 y.o.   MRN: [id number]  HPI  Mallory Hardy is a 78 yr old female who presents today for follow up.  DM2- last eye exam >2 yrs.  Does not check sugars.  Depression/anxiety- Denies SOB/CP or swelling.  Overall stable.  Not currently on medications for depression/anxiety.   HTN- Denies CP/SOB swelling. She is maintained on HCTZ.  Hypothyroid- continues synthroid.  Review of Systems see HPI  Past Medical History  Diagnosis Date  . Urinary tract infection   . Thyroid disease     hypothyroidism  . Migraine headache   . Renal calculus   . Allergy     seasonal  . Anxiety   . Depression   . GERD (gastroesophageal reflux disease)   . Constipation, chronic   . Diverticulosis   . Hypertension     History   Social History  . Marital Status: Widowed    Spouse Name: N/A    Number of Children: 2  . Years of Education: N/A   Occupational History  . Retired    Social History Main Topics  . Smoking status: Never Smoker   . Smokeless tobacco: Never Used  . Alcohol Use: No  . Drug Use: No  . Sexually Active: Not on file   Other Topics Concern  . Not on file   Social History Narrative  . No narrative on file    Past Surgical History  Procedure Laterality Date  . Thyroidectomy  1959    goiter  . Abdominal exploration surgery  1959  . Rotator cuff repair  2007  . Bilateral oophorectomy  2003  . Abdominal hysterectomy  1978    Family History  Problem Relation Age of Onset  . Cancer Neg Hx   . Diabetes Neg Hx   . Heart attack Neg Hx   . Hyperlipidemia Neg Hx   . Sudden death Neg Hx   . Hypertension Mother     No Known Allergies  Current Outpatient Prescriptions on File Prior to Visit  Medication Sig Dispense Refill  . aspirin 81 MG EC tablet Take 1 tablet 3 x weekly.      Marland Kitchen b complex vitamins tablet Take 1 tablet by mouth daily.        . Calcium Carbonate-Vitamin D (CALCIUM 600-D)  600-400 MG-UNIT per tablet Take 1 tablet by mouth 2 (two) times daily.        . cholecalciferol (VITAMIN D) 1000 UNITS tablet Take 1,000 Units by mouth daily.      . diclofenac sodium (VOLTAREN) 1 % GEL Apply topically. Apply 3 times a day for 1 week as needed to the neck.       . diphenhydrAMINE (BENADRYL) 25 MG tablet Take 25 mg by mouth at bedtime as needed. Allergies        . hydrochlorothiazide (MICROZIDE) 12.5 MG capsule Take 1 capsule (12.5 mg total) by mouth daily.  90 capsule  1  . levothyroxine (SYNTHROID, LEVOTHROID) 50 MCG tablet Take 1 tablet (50 mcg total) by mouth daily.  90 tablet  1  . Multiple Vitamins-Minerals (MULTIVITAMIN WITH MINERALS) tablet Take 1 tablet by mouth daily.        . Multiple Vitamins-Minerals (OCUVITE PO) Take by mouth.      . naproxen sodium (ANAPROX) 220 MG tablet Take 220 mg by mouth as needed. Aleve.      . ranitidine (ZANTAC) 150 MG tablet Take  150 mg by mouth 2 (two) times daily.         No current facility-administered medications on file prior to visit.    BP 138/88  Pulse 70  Temp(Src) 97.9 F (36.6 C) (Oral)  Resp 16  Ht 5' 6.5" (1.689 m)  Wt 169 lb 0.6 oz (76.676 kg)  BMI 26.88 kg/m2  SpO2 97%       Objective:   Physical Exam        Assessment & Plan:

## 2013-03-07 NOTE — Telephone Encounter (Signed)
Pt will follow up in 3 months for the following labs:  BMET- Hyperglycemia Urine microalbumin- hyperglycemia Lipid panel- hyperlipidemia LFT- hyperlipidemia tsh- hypothyroid

## 2013-03-08 ENCOUNTER — Telehealth: Payer: Self-pay | Admitting: *Deleted

## 2013-03-08 NOTE — Assessment & Plan Note (Signed)
BP Readings from Last 3 Encounters:  03/07/13 138/88  12/13/12 130/80  08/17/12 128/82   BP stable on HCTZ. Continue same, obtain bmet.

## 2013-03-08 NOTE — Telephone Encounter (Signed)
Left detailed message on home # re: below instructions and to call if any questions. Future lab order placed.

## 2013-03-08 NOTE — Assessment & Plan Note (Signed)
Stable.  Monitor.  

## 2013-03-08 NOTE — Assessment & Plan Note (Signed)
TSH normal last visit. Continue synthroid.

## 2013-03-08 NOTE — Telephone Encounter (Signed)
Message copied by Kathi Simpers on Thu Mar 08, 2013 10:45 AM ------      Message from: O'SULLIVAN, MELISSA      Created: Wed Mar 07, 2013 10:46 PM       Sugar is well controlled, improved since last visit. Calcium is mildly elevated.  Hold calcium supplement. Repeat serum calcium in 1 month (dx hypercalcemia). ------

## 2013-03-08 NOTE — Assessment & Plan Note (Signed)
A1C is improved at 5.7.  Continue diabetic diet.

## 2013-03-15 NOTE — Telephone Encounter (Signed)
Orders entered

## 2013-04-11 ENCOUNTER — Encounter: Payer: Self-pay | Admitting: Family

## 2013-04-11 ENCOUNTER — Ambulatory Visit (INDEPENDENT_AMBULATORY_CARE_PROVIDER_SITE_OTHER): Payer: Medicare Other | Admitting: Family

## 2013-04-11 ENCOUNTER — Telehealth: Payer: Self-pay | Admitting: *Deleted

## 2013-04-11 VITALS — BP 124/86 | HR 62 | Temp 98.0°F | Resp 16 | Ht 66.5 in | Wt 166.1 lb

## 2013-04-11 DIAGNOSIS — R195 Other fecal abnormalities: Secondary | ICD-10-CM

## 2013-04-11 LAB — CBC WITH DIFFERENTIAL/PLATELET
Eosinophils Absolute: 0.2 10*3/uL (ref 0.0–0.7)
Eosinophils Relative: 3 % (ref 0–5)
Hemoglobin: 14.1 g/dL (ref 12.0–15.0)
Lymphocytes Relative: 30 % (ref 12–46)
Lymphs Abs: 2 10*3/uL (ref 0.7–4.0)
MCH: 31.5 pg (ref 26.0–34.0)
MCV: 86.2 fL (ref 78.0–100.0)
Monocytes Relative: 8 % (ref 3–12)
Neutrophils Relative %: 59 % (ref 43–77)
Platelets: 244 10*3/uL (ref 150–400)
RBC: 4.48 MIL/uL (ref 3.87–5.11)
WBC: 6.6 10*3/uL (ref 4.0–10.5)

## 2013-04-11 NOTE — Progress Notes (Signed)
Subjective:    Patient ID: Mallory Hardy, female    DOB: 1935/08/26, 78 y.o.   MRN: [id number]  HPI  Dark stools started yesterday at 2:30. Starting Sunday, had multiple bowel movements (formed) "after everything that I eat."  She has not eaten since yesterday at 5:30 PM.  She reports that she has been taking aleve 2-3 times a week due to shoulder pain. She denies dizziness, nausea/vomitting.    Review of Systems See HPI  Past Medical History  Diagnosis Date  . Urinary tract infection   . Thyroid disease     hypothyroidism  . Migraine headache   . Renal calculus   . Allergy     seasonal  . Anxiety   . Depression   . GERD (gastroesophageal reflux disease)   . Constipation, chronic   . Diverticulosis   . Hypertension     History   Social History  . Marital Status: Widowed    Spouse Name: N/A    Number of Children: 2  . Years of Education: N/A   Occupational History  . Retired    Social History Main Topics  . Smoking status: Never Smoker   . Smokeless tobacco: Never Used  . Alcohol Use: No  . Drug Use: No  . Sexually Active: Not on file   Other Topics Concern  . Not on file   Social History Narrative  . No narrative on file    Past Surgical History  Procedure Laterality Date  . Thyroidectomy  1959    goiter  . Abdominal exploration surgery  1959  . Rotator cuff repair  2007  . Bilateral oophorectomy  2003  . Abdominal hysterectomy  1978    Family History  Problem Relation Age of Onset  . Cancer Neg Hx   . Diabetes Neg Hx   . Heart attack Neg Hx   . Hyperlipidemia Neg Hx   . Sudden death Neg Hx   . Hypertension Mother     No Known Allergies  Current Outpatient Prescriptions on File Prior to Visit  Medication Sig Dispense Refill  . aspirin 81 MG EC tablet Take 1 tablet 3 x weekly.      Marland Kitchen b complex vitamins tablet Take 1 tablet by mouth daily.        . Biotin 5000 MCG CAPS Take 1 capsule by mouth daily.      . cholecalciferol (VITAMIN D)  1000 UNITS tablet Take 1,000 Units by mouth daily.      . diclofenac sodium (VOLTAREN) 1 % GEL Apply topically. Apply 3 times a day for 1 week as needed to the neck.       . diphenhydrAMINE (BENADRYL) 25 MG tablet Take 25 mg by mouth at bedtime as needed. Allergies        . hydrochlorothiazide (MICROZIDE) 12.5 MG capsule Take 1 capsule (12.5 mg total) by mouth daily.  90 capsule  1  . levothyroxine (SYNTHROID, LEVOTHROID) 50 MCG tablet Take 1 tablet (50 mcg total) by mouth daily.  90 tablet  1  . naproxen sodium (ANAPROX) 220 MG tablet Take 220 mg by mouth as needed. Aleve.      . ranitidine (ZANTAC) 150 MG tablet Take 150 mg by mouth 2 (two) times daily.        . Calcium Carbonate-Vitamin D (CALCIUM 600-D) 600-400 MG-UNIT per tablet Take 1 tablet by mouth 2 (two) times daily.        . Multiple Vitamins-Minerals (MULTIVITAMIN WITH MINERALS) tablet Take  1 tablet by mouth daily.        . Multiple Vitamins-Minerals (OCUVITE PO) Take by mouth.       No current facility-administered medications on file prior to visit.    BP 124/86  Pulse 62  Temp(Src) 98 F (36.7 C) (Oral)  Resp 16  Ht 5' 6.5" (1.689 m)  Wt 166 lb 1.3 oz (75.333 kg)  BMI 26.41 kg/m2  SpO2 99%       Objective:   Physical Exam  Constitutional: She appears well-developed and well-nourished. No distress.  Cardiovascular: Normal rate and regular rhythm.   No murmur heard. Pulmonary/Chest: Effort normal and breath sounds normal. No respiratory distress. She has no wheezes. She has no rales. She exhibits no tenderness.  Abdominal:  Abdomen is soft, normoactive BS.  Mild diffusely tender abdomen is noted.  Genitourinary: Guaiac negative stool.  Stool is hard and dark in color.            Assessment & Plan:

## 2013-04-11 NOTE — Telephone Encounter (Signed)
Left detailed message on home# and to call if any questions. 

## 2013-04-11 NOTE — Telephone Encounter (Signed)
Received call from Selena Batten at [facility]. STAT CBC results are available in EPIC. Please advise.

## 2013-04-11 NOTE — Telephone Encounter (Signed)
Please let pt know that her hemoglobin is normal.

## 2013-04-11 NOTE — Patient Instructions (Addendum)
Please complete your lab work prior to leaving. Call if abdominal pain worsens, if it does not improve, if you develop diarrhea or bright red blood in stool.

## 2013-04-11 NOTE — Assessment & Plan Note (Signed)
Heme negative today.  Will obtain CBC.  I suspect resolving gastroenteritis, however pt is instructed to call if symptoms worsen or if they fail to continue to improve in the next few days.

## 2013-06-20 ENCOUNTER — Other Ambulatory Visit: Payer: Self-pay | Admitting: Family

## 2013-06-21 LAB — LIPID PANEL
Cholesterol: 177 mg/dL (ref 0–200)
HDL: 63 mg/dL (ref >39)
Total CHOL/HDL Ratio: 2.8 Ratio
Triglycerides: 84 mg/dL (ref <150)
VLDL: 17 mg/dL (ref 0–40)

## 2013-06-21 LAB — MICROALBUMIN / CREATININE URINE RATIO
Microalb Creat Ratio: 15.1 mg/g (ref 0.0–30.0)
Microalb, Ur: 3.08 mg/dL — ABNORMAL HIGH (ref 0.00–1.89)

## 2013-06-21 LAB — BASIC METABOLIC PANEL
Calcium: 9.2 mg/dL (ref 8.4–10.5)
Glucose, Bld: 102 mg/dL — ABNORMAL HIGH (ref 70–99)
Potassium: 4.1 mEq/L (ref 3.5–5.3)
Sodium: 142 mEq/L (ref 135–145)

## 2013-06-21 LAB — HEPATIC FUNCTION PANEL
ALT: 22 U/L (ref 0–35)
AST: 24 U/L (ref 0–37)
Albumin: 3.9 g/dL (ref 3.5–5.2)
Total Protein: 6.6 g/dL (ref 6.0–8.3)

## 2013-06-21 LAB — TSH: TSH: 0.816 u[IU]/mL (ref 0.350–4.500)

## 2013-06-24 ENCOUNTER — Other Ambulatory Visit: Payer: Self-pay | Admitting: Family

## 2013-06-24 ENCOUNTER — Encounter: Payer: Self-pay | Admitting: Family

## 2013-06-24 MED ORDER — LISINOPRIL 2.5 MG PO TABS: 2.5000 mg | ORAL_TABLET | Freq: Every day | ORAL | Status: DC

## 2013-06-24 NOTE — Telephone Encounter (Signed)
Urine shows small amount of protein. i would like her to add low dose lisinopril to protect her kidneys. Follow up in 2 weeks for bp check and bmet.

## 2013-06-26 MED ORDER — LISINOPRIL 2.5 MG PO TABS: 2.5000 mg | ORAL_TABLET | Freq: Every day | ORAL | Status: DC

## 2013-06-26 NOTE — Telephone Encounter (Signed)
Left message on home # to return my call. 

## 2013-06-27 ENCOUNTER — Ambulatory Visit (INDEPENDENT_AMBULATORY_CARE_PROVIDER_SITE_OTHER): Payer: Medicare Other | Admitting: Family

## 2013-06-27 ENCOUNTER — Encounter: Payer: Self-pay | Admitting: Family

## 2013-06-27 VITALS — BP 120/86 | HR 64 | Temp 98.4°F | Resp 16 | Ht 66.0 in | Wt 169.1 lb

## 2013-06-27 DIAGNOSIS — G8929 Other chronic pain: Secondary | ICD-10-CM

## 2013-06-27 DIAGNOSIS — F341 Dysthymic disorder: Secondary | ICD-10-CM

## 2013-06-27 DIAGNOSIS — H269 Unspecified cataract: Secondary | ICD-10-CM

## 2013-06-27 DIAGNOSIS — Z23 Encounter for immunization: Secondary | ICD-10-CM

## 2013-06-27 DIAGNOSIS — G43909 Migraine, unspecified, not intractable, without status migrainosus: Secondary | ICD-10-CM

## 2013-06-27 DIAGNOSIS — I10 Essential (primary) hypertension: Secondary | ICD-10-CM

## 2013-06-27 DIAGNOSIS — R7309 Other abnormal glucose: Secondary | ICD-10-CM

## 2013-06-27 DIAGNOSIS — K219 Gastro-esophageal reflux disease without esophagitis: Secondary | ICD-10-CM

## 2013-06-27 DIAGNOSIS — M542 Cervicalgia: Secondary | ICD-10-CM

## 2013-06-27 DIAGNOSIS — Z13820 Encounter for screening for osteoporosis: Secondary | ICD-10-CM

## 2013-06-27 DIAGNOSIS — Z0000 Encounter for general adult medical examination without abnormal findings: Secondary | ICD-10-CM

## 2013-06-27 DIAGNOSIS — E039 Hypothyroidism, unspecified: Secondary | ICD-10-CM

## 2013-06-27 MED ORDER — HYDROCHLOROTHIAZIDE 12.5 MG PO CAPS: 12.5000 mg | ORAL_CAPSULE | Freq: Every day | ORAL | Status: DC

## 2013-06-27 MED ORDER — LEVOTHYROXINE SODIUM 50 MCG PO TABS: 50.0000 ug | ORAL_TABLET | Freq: Every day | ORAL | Status: DC

## 2013-06-27 NOTE — Assessment & Plan Note (Signed)
Last A1C was 5.7.  Clinically stable. Monitor.

## 2013-06-27 NOTE — Patient Instructions (Addendum)
Please follow up in 2 weeks for nurse visit to have you blood pressure checked and follow up blood work.

## 2013-06-27 NOTE — Assessment & Plan Note (Signed)
TSH stable on current dose of synthroid. Monitor.

## 2013-06-27 NOTE — Assessment & Plan Note (Signed)
Deteriorated. Recommended that she switch from zantac once daily to omeprazole 20mg  once daily.

## 2013-06-27 NOTE — Assessment & Plan Note (Signed)
Stable off of meds.  

## 2013-06-27 NOTE — Progress Notes (Signed)
Subjective:    Patient ID: Mallory Hardy, female    DOB: [birth year], 78 y.o.   MRN: [id number]  HPI  Subjective:   Patient here for Medicare annual wellness visit and management of other chronic and acute problems.  Hypothyroid- pt is currently maintained on levothyroxine.  Chronic neck pain- reports that this is intermittent  Migraine- denies recent migraines.    Depression/anxiety- not currently on antidepressant. Reports that this is well controlled.   HTN- She is currently maintained on hctz.  Cataracts- following with Dr. Hazle Quant.  She has cataract surgery on 9/29.    Patient presents today for complete physical.  GERD-  Reports that she takes generic zantac otc once daily at night which seems to help.    Immunizations: Due for flu shot. Diet: trying to work on diet, still has room for improvement Exercise: Not exercising much Colonoscopy: up to date Dexa:  Pap Smear: s/p TAH/BSO Mammogram: up to date  Risk factors:   Roster of Physicians Providing Medical Care to Patient: Activities of Daily Living  In your present state of health, do you have any difficulty performing the following activities? Preparing food and eating?: No  Bathing yourself: No  Getting dressed: No  Using the toilet:No  Moving around from place to place: No  In the past year have you fallen or had a near fall?:No    Home Safety: Has smoke detector and wears seat belts. No firearms. No excess sun exposure.  Diet and Exercise  Current exercise habits: none Dietary issues discussed: discussed healthy diet.   Depression Screen  (Note: if answer to either of the following is "Yes", then a more complete depression screening is indicated)  Q1: Over the past two weeks, have you felt down, depressed or hopeless?no  Q2: Over the past two weeks, have you felt little interest or pleasure in doing things? no   The following portions of the patient's history were reviewed and updated as appropriate:  allergies, current medications, past family history, past medical history, past social history, past surgical history and problem list.   Past Medical History  Diagnosis Date  . Urinary tract infection   . Thyroid disease     hypothyroidism  . Migraine headache   . Renal calculus   . Allergy     seasonal  . Anxiety   . Depression   . GERD (gastroesophageal reflux disease)   . Constipation, chronic   . Diverticulosis   . Hypertension     History   Social History  . Marital Status: Widowed    Spouse Name: N/A    Number of Children: 2  . Years of Education: N/A   Occupational History  . Retired    Social History Main Topics  . Smoking status: Never Smoker   . Smokeless tobacco: Never Used  . Alcohol Use: No  . Drug Use: No  . Sexual Activity: Not on file   Other Topics Concern  . Not on file   Social History Narrative  . No narrative on file    Past Surgical History  Procedure Laterality Date  . Thyroidectomy  1959    goiter  . Abdominal exploration surgery  1959  . Rotator cuff repair  2007  . Bilateral oophorectomy  2003  . Abdominal hysterectomy  1978    Family History  Problem Relation Age of Onset  . Cancer Neg Hx   . Diabetes Neg Hx   . Heart attack Neg Hx   .  Hyperlipidemia Neg Hx   . Sudden death Neg Hx   . Hypertension Mother   . Atrial fibrillation Sister     No Known Allergies  Current Outpatient Prescriptions on File Prior to Visit  Medication Sig Dispense Refill  . aspirin 81 MG EC tablet Take 1 tablet 3 x weekly.      Marland Kitchen b complex vitamins tablet Take 1 tablet by mouth daily.        . Biotin 5000 MCG CAPS Take 1 capsule by mouth daily.      . cholecalciferol (VITAMIN D) 1000 UNITS tablet Take 1,000 Units by mouth daily.      . diclofenac sodium (VOLTAREN) 1 % GEL Apply topically. Apply 3 times a day for 1 week as needed to the neck.       . diphenhydrAMINE (BENADRYL) 25 MG tablet Take 25 mg by mouth every morning. Allergies       .  hydrochlorothiazide (MICROZIDE) 12.5 MG capsule Take 1 capsule (12.5 mg total) by mouth daily.  90 capsule  1  . levothyroxine (SYNTHROID, LEVOTHROID) 50 MCG tablet Take 1 tablet (50 mcg total) by mouth daily.  90 tablet  1  . Multiple Vitamins-Minerals (MULTIVITAMIN WITH MINERALS) tablet Take 1 tablet by mouth daily.        . naproxen sodium (ANAPROX) 220 MG tablet Take 220 mg by mouth as needed. Aleve.      . ranitidine (ZANTAC) 150 MG tablet Take 150 mg by mouth at bedtime.       Marland Kitchen lisinopril (PRINIVIL,ZESTRIL) 2.5 MG tablet Take 1 tablet (2.5 mg total) by mouth daily.  30 tablet  0   No current facility-administered medications on file prior to visit.    BP 120/86  Pulse 64  Temp(Src) 98.4 F (36.9 C) (Oral)  Resp 16  Ht 5\' 6"  (1.676 m)  Wt 169 lb 1.3 oz (76.694 kg)  BMI 27.3 kg/m2  SpO2 99%     Objective:   Vision:see nursing Hearing: grossly intact Body mass index:  See nursing.  Assessment:   Medicare wellness utd on preventive parameters   Plan:   During the course of the visit the patient was educated and counseled about appropriate screening and preventive services including:       Fall prevention   Screening mammography  Bone densitometry screening  Nutrition counseling   Vaccines / LABS Flu shot  Patient Instructions (the written plan) was given to the patient.       Review of Systems  Constitutional: Negative for unexpected weight change.  HENT: Negative for congestion.   Eyes:       Cataracts  Respiratory: Negative for cough and shortness of breath.   Cardiovascular: Negative for chest pain.  Gastrointestinal:       + nausea with acid reflux  Genitourinary: Negative for dysuria, urgency and frequency.  Musculoskeletal: Negative for myalgias.       Some right wrist pain.    Neurological: Negative for headaches.  Hematological: Negative for adenopathy.  Psychiatric/Behavioral:       Denies depression     Objective:   Physical  Exam  Physical Exam  Constitutional: She is oriented to person, place, and time. She appears well-developed and well-nourished. No distress.  HENT:  Head: Normocephalic and atraumatic.  Right Ear: Tympanic membrane and ear canal normal.  Left Ear: Tympanic membrane and ear canal normal.  Mouth/Throat: Oropharynx is clear and moist.  Eyes: Pupils are equal, round, and reactive to light. No  scleral icterus.  Neck: Normal range of motion. No thyromegaly present.  Cardiovascular: Normal rate and regular rhythm.   No murmur heard. Pulmonary/Chest: Effort normal and breath sounds normal. No respiratory distress. He has no wheezes. She has no rales. She exhibits no tenderness.  Abdominal: Soft. Bowel sounds are normal. He exhibits no distension and no mass. There is no tenderness. There is no rebound and no guarding.  Musculoskeletal: She exhibits no edema.  Lymphadenopathy:    She has no cervical adenopathy.  Neurological: She is alert and oriented to person, place, and time.  She exhibits normal muscle tone. Coordination normal.  Skin: Skin is warm and dry.  Psychiatric: She has a normal mood and affect. Her behavior is normal. Judgment and thought content normal.  Breasts: Examined lying Right: Without masses, retractions, discharge or axillary adenopathy.  Left: Without masses, retractions, discharge or axillary adenopathy.  Pelvic: deferred- s/p TAH/BSO        Assessment & Plan:         Assessment & Plan:

## 2013-06-27 NOTE — Assessment & Plan Note (Signed)
Stable.  Monitor.  

## 2013-06-27 NOTE — Assessment & Plan Note (Signed)
For cataract removal later this month.

## 2013-06-27 NOTE — Assessment & Plan Note (Signed)
Intermittent, monitor.

## 2013-06-27 NOTE — Assessment & Plan Note (Signed)
BP stable.  Low dose lisinopril was added for renal protection due to + microalbuminuria.

## 2013-06-27 NOTE — Telephone Encounter (Signed)
Notified pt. She will discuss further at her appointment today.

## 2013-07-11 ENCOUNTER — Ambulatory Visit (INDEPENDENT_AMBULATORY_CARE_PROVIDER_SITE_OTHER): Payer: Medicare Other | Admitting: Family

## 2013-07-11 VITALS — BP 120/66 | HR 69 | Resp 16 | Ht 66.0 in

## 2013-07-11 DIAGNOSIS — R002 Palpitations: Secondary | ICD-10-CM

## 2013-07-11 DIAGNOSIS — I10 Essential (primary) hypertension: Secondary | ICD-10-CM

## 2013-07-11 DIAGNOSIS — Z23 Encounter for immunization: Secondary | ICD-10-CM

## 2013-07-11 LAB — BASIC METABOLIC PANEL
BUN: 25 mg/dL — ABNORMAL HIGH (ref 6–23)
CO2: 30 mEq/L (ref 19–32)
Chloride: 103 mEq/L (ref 96–112)
Creat: 0.82 mg/dL (ref 0.50–1.10)
Potassium: 4.2 mEq/L (ref 3.5–5.3)

## 2013-07-11 MED ORDER — LISINOPRIL 2.5 MG PO TABS: 2.5000 mg | ORAL_TABLET | Freq: Every day | ORAL | Status: DC

## 2013-07-11 NOTE — Assessment & Plan Note (Addendum)
BP Readings from Last 3 Encounters:  07/11/13 120/66  06/27/13 120/86  04/11/13 124/86   Improved on lisinopril. Obtain bmet.

## 2013-07-11 NOTE — Patient Instructions (Addendum)
Please complete your lab work prior to leaving. Follow up in January as scheduled. Call if palpitations worsen or if they become more frequent.

## 2013-07-11 NOTE — Assessment & Plan Note (Signed)
Mild, infrequent. Obtain bmet to assess electrolytes. EKG shows normal sinus rhythm.  Recent normal TSH.  Advised pt to call if symptoms become more severe or more frequent. Could consider holter monitor at that time.

## 2013-07-11 NOTE — Progress Notes (Signed)
Subjective:    Patient ID: Mallory Hardy, female    DOB: 08-Feb-1935, 78 y.o.   MRN: [id number]  HPI  Mallory Hardy is a 78 yr old female who presents today for follow up of her blood pressure.    Last visit she was started on lisinopril for her blood pressure. She does report some intermittent "fluttering" in her chest.  Reports that generally when this occurs she is sitting down.  When she checks her pulse "it isn't beating faster." Worse when she gets upset.    Rosacea-  Has appointment with her dermatologist next month.   Review of Systems See HPI  Past Medical History  Diagnosis Date  . Urinary tract infection   . Thyroid disease     hypothyroidism  . Migraine headache   . Renal calculus   . Allergy     seasonal  . Anxiety   . Depression   . GERD (gastroesophageal reflux disease)   . Constipation, chronic   . Diverticulosis   . Hypertension     History   Social History  . Marital Status: Widowed    Spouse Name: N/A    Number of Children: 2  . Years of Education: N/A   Occupational History  . Retired    Social History Main Topics  . Smoking status: Never Smoker   . Smokeless tobacco: Never Used  . Alcohol Use: No  . Drug Use: No  . Sexual Activity: Not on file   Other Topics Concern  . Not on file   Social History Narrative  . No narrative on file    Past Surgical History  Procedure Laterality Date  . Thyroidectomy  1959    goiter  . Abdominal exploration surgery  1959  . Rotator cuff repair  2007  . Bilateral oophorectomy  2003  . Abdominal hysterectomy  1978    Family History  Problem Relation Age of Onset  . Cancer Neg Hx   . Diabetes Neg Hx   . Heart attack Neg Hx   . Hyperlipidemia Neg Hx   . Sudden death Neg Hx   . Hypertension Mother   . Atrial fibrillation Sister     No Known Allergies  Current Outpatient Prescriptions on File Prior to Visit  Medication Sig Dispense Refill  . aspirin 81 MG EC tablet Take 1 tablet 3 x weekly.       Marland Kitchen b complex vitamins tablet Take 1 tablet by mouth daily.        . Biotin 5000 MCG CAPS Take 1 capsule by mouth daily.      . cholecalciferol (VITAMIN D) 1000 UNITS tablet Take 1,000 Units by mouth daily.      . diclofenac sodium (VOLTAREN) 1 % GEL Apply topically. Apply 3 times a day for 1 week as needed to the neck.       . diphenhydrAMINE (BENADRYL) 25 MG tablet Take 25 mg by mouth every morning. Allergies       . hydrochlorothiazide (MICROZIDE) 12.5 MG capsule Take 1 capsule (12.5 mg total) by mouth daily.  90 capsule  1  . levothyroxine (SYNTHROID, LEVOTHROID) 50 MCG tablet Take 1 tablet (50 mcg total) by mouth daily.  90 tablet  1  . Multiple Vitamins-Minerals (MULTIVITAMIN WITH MINERALS) tablet Take 1 tablet by mouth daily.        . naproxen sodium (ANAPROX) 220 MG tablet Take 220 mg by mouth as needed. Aleve.      Marland Kitchen omeprazole (PRILOSEC OTC)  20 MG tablet Take 20 mg by mouth daily.       No current facility-administered medications on file prior to visit.    BP 120/66  Pulse 69  Resp 16  Ht 5\' 6"  (1.676 m)  SpO2 99%        Objective:   Physical Exam  Constitutional: She is oriented to person, place, and time. She appears well-developed and well-nourished. No distress.  Cardiovascular: Normal rate and regular rhythm.   No murmur heard. Pulmonary/Chest: Effort normal and breath sounds normal. No respiratory distress. She has no wheezes. She has no rales. She exhibits no tenderness.  Musculoskeletal: She exhibits no edema.  Neurological: She is alert and oriented to person, place, and time.  Skin: Skin is warm and dry.  Mild facial rosacea.    Psychiatric: She has a normal mood and affect. Her behavior is normal. Judgment and thought content normal.          Assessment & Plan:

## 2013-07-12 ENCOUNTER — Telehealth: Payer: Self-pay | Admitting: *Deleted

## 2013-07-12 ENCOUNTER — Encounter: Payer: Self-pay | Admitting: Family

## 2013-07-12 NOTE — Telephone Encounter (Signed)
Received message from Nash Dimmer at [facility] requesting copy of recent EKG be faxed to [phone].  EKG faxed.

## 2013-09-04 ENCOUNTER — Ambulatory Visit: Payer: Self-pay

## 2013-10-19 ENCOUNTER — Telehealth: Payer: Self-pay | Admitting: Family

## 2013-10-19 NOTE — Telephone Encounter (Signed)
Patient Information:  Caller Name: Mallory Hardy  Phone: [phone]  Patient: Mallory Hardy  Gender: Female  DOB: [birth year]  Age: 79 Years  PCP: Debbrah Alar (Adults only)  Office Follow Up:  Does the office need to follow up with this patient?: Yes  Instructions For The Office: appt needs krs/can  RN Note:  Onset of diarrhea 10/18/13 2330 which lasted about 4 hours.  States onset of blood in diarrhea 10/19/13 1100.  States she feels slightly weak, but states she is doing ok.  Afebrile.  States her last BM appeared to be all blood but "only a few drops, like from a hemorrhoid."  c/o crampy abdominal pain which resolves for a while after BM.  Per Rectal Bleeding protocol, advised appt within 4 hours due to "blood passed alone without any stool."  No appts available within dispositioned time frame at any [place] practice per epic; info to office for provider review/workin appt/callback.  May reach patient at [phone].  krs/can  Symptoms  Reason For Call & Symptoms: diarrhea  Reviewed Health History In EMR: Yes  Reviewed Medications In EMR: Yes  Reviewed Allergies In EMR: Yes  Reviewed Surgeries / Procedures: Yes  Date of Onset of Symptoms: 10/18/2013  Guideline(s) Used:  Rectal Bleeding  Disposition Per Guideline:   See Today in Office  Reason For Disposition Reached:   Blood passed alone without any stool  Advice Given:  N/A  Patient Will Follow Care Advice:  YES

## 2013-10-19 NOTE — Telephone Encounter (Signed)
Spoke with pt she believes its from her internal hemorrids, spoke with Einar Pheasant, pt was made aware to keep hydrated and if she gets any worse to go to er.

## 2013-10-19 NOTE — Telephone Encounter (Addendum)
Patient should make sure she is staying well hydrated.  Restart foods as tolerated.  If N/V/D persists or if patient not able to tolerate PO fluids, patient needs to be seen in ER.  No available appointments.

## 2013-10-19 NOTE — Telephone Encounter (Signed)
vomiting and bowel movements are nothing but blood, did transfer to call a nurse, pt states they left her on hold for 30 mins. Please advise.

## 2013-10-24 ENCOUNTER — Encounter: Payer: Self-pay | Admitting: Family

## 2013-10-24 ENCOUNTER — Ambulatory Visit (INDEPENDENT_AMBULATORY_CARE_PROVIDER_SITE_OTHER): Payer: Medicare Other | Admitting: Family

## 2013-10-24 ENCOUNTER — Ambulatory Visit (HOSPITAL_BASED_OUTPATIENT_CLINIC_OR_DEPARTMENT_OTHER)
Admission: RE | Admit: 2013-10-24 | Discharge: 2013-10-24 | Disposition: A | Discharge status: Home / Self Care | Payer: Medicare Other | Source: Ambulatory Visit | Attending: Family | Admitting: Family

## 2013-10-24 ENCOUNTER — Telehealth: Payer: Self-pay | Admitting: *Deleted

## 2013-10-24 VITALS — BP 140/80 | HR 80 | Temp 98.0°F | Resp 16 | Ht 66.0 in | Wt 169.1 lb

## 2013-10-24 DIAGNOSIS — K625 Hemorrhage of anus and rectum: Secondary | ICD-10-CM | POA: Insufficient documentation

## 2013-10-24 DIAGNOSIS — F341 Dysthymic disorder: Secondary | ICD-10-CM

## 2013-10-24 DIAGNOSIS — E039 Hypothyroidism, unspecified: Secondary | ICD-10-CM

## 2013-10-24 DIAGNOSIS — R109 Unspecified abdominal pain: Secondary | ICD-10-CM | POA: Insufficient documentation

## 2013-10-24 DIAGNOSIS — I10 Essential (primary) hypertension: Secondary | ICD-10-CM

## 2013-10-24 DIAGNOSIS — K7689 Other specified diseases of liver: Secondary | ICD-10-CM | POA: Insufficient documentation

## 2013-10-24 DIAGNOSIS — N200 Calculus of kidney: Secondary | ICD-10-CM | POA: Insufficient documentation

## 2013-10-24 LAB — CBC WITH DIFFERENTIAL/PLATELET
Basophils Absolute: 0 10*3/uL (ref 0.0–0.1)
Basophils Relative: 1 % (ref 0–1)
Eosinophils Absolute: 0.2 10*3/uL (ref 0.0–0.7)
Eosinophils Relative: 3 % (ref 0–5)
HCT: 37.3 % (ref 36.0–46.0)
HEMOGLOBIN: 13.3 g/dL (ref 12.0–15.0)
LYMPHS ABS: 1.9 10*3/uL (ref 0.7–4.0)
LYMPHS PCT: 28 % (ref 12–46)
MCH: 31.8 pg (ref 26.0–34.0)
MCHC: 35.7 g/dL (ref 30.0–36.0)
MCV: 89.2 fL (ref 78.0–100.0)
MONOS PCT: 11 % (ref 3–12)
Monocytes Absolute: 0.7 10*3/uL (ref 0.1–1.0)
NEUTROS ABS: 3.8 10*3/uL (ref 1.7–7.7)
NEUTROS PCT: 57 % (ref 43–77)
Platelets: 272 10*3/uL (ref 150–400)
RBC: 4.18 MIL/uL (ref 3.87–5.11)
RDW: 13.6 % (ref 11.5–15.5)
WBC: 6.6 10*3/uL (ref 4.0–10.5)

## 2013-10-24 NOTE — Progress Notes (Signed)
Subjective:    Patient ID: Mallory Hardy, female    DOB: Jan 09, 1935, 79 y.o.   MRN: [id number]  HPI  Ms Mallory Hardy is  79 yr old female who presents today for follow up of multiple medical problems and to discuss abdominal pain.  1) abdominal pain- reports that on new years day she woke up with terrible abdominal pain.  Pain was dep in the lower abdomen.  She had  Large amount of stool and gas following that abdominal pain. Returned to bed then had several loose stools through night. She continued to have bloody stools every 45 minutes or so that day.  Blood finally stopped.  She does have hx of internal hemorrhoids.  Reports that yesterday the pain resolved.  Reports that her last BM was Sunday.   2) Depression- not currently on med. Reports well controlled  3) HTN-  On lisinopril and hctz. BP up slightly today. Follow up bp 122/62. She denies SOB, swelling.   BP Readings from Last 3 Encounters:  10/24/13 140/80  07/11/13 120/66  06/27/13 120/86   4) Hypothyroid- on synthoid. Reports feeling well on this dose.   Review of Systems See HPI  Past Medical History  Diagnosis Date  . Urinary tract infection   . Thyroid disease     hypothyroidism  . Migraine headache   . Renal calculus   . Allergy     seasonal  . Anxiety   . Depression   . GERD (gastroesophageal reflux disease)   . Constipation, chronic   . Diverticulosis   . Hypertension     History   Social History  . Marital Status: Widowed    Spouse Name: N/A    Number of Children: 2  . Years of Education: N/A   Occupational History  . Retired    Social History Main Topics  . Smoking status: Never Smoker   . Smokeless tobacco: Never Used  . Alcohol Use: No  . Drug Use: No  . Sexual Activity: Not on file   Other Topics Concern  . Not on file   Social History Narrative  . No narrative on file    Past Surgical History  Procedure Laterality Date  . Thyroidectomy  1959    goiter  . Abdominal  exploration surgery  1959  . Rotator cuff repair  2007  . Bilateral oophorectomy  2003  . Abdominal hysterectomy  1978    Family History  Problem Relation Age of Onset  . Cancer Neg Hx   . Diabetes Neg Hx   . Heart attack Neg Hx   . Hyperlipidemia Neg Hx   . Sudden death Neg Hx   . Hypertension Mother   . Atrial fibrillation Sister     No Known Allergies  Current Outpatient Prescriptions on File Prior to Visit  Medication Sig Dispense Refill  . aspirin 81 MG EC tablet Take 1 tablet 3 x weekly.      Marland Kitchen b complex vitamins tablet Take 1 tablet by mouth daily.        . Biotin 5000 MCG CAPS Take 1 capsule by mouth daily.      . cholecalciferol (VITAMIN D) 1000 UNITS tablet Take 1,000 Units by mouth daily.      . diclofenac sodium (VOLTAREN) 1 % GEL Apply topically. Apply 3 times a day for 1 week as needed to the neck.       . diphenhydrAMINE (BENADRYL) 25 MG tablet Take 25 mg by mouth every morning.  Allergies       . hydrochlorothiazide (MICROZIDE) 12.5 MG capsule Take 1 capsule (12.5 mg total) by mouth daily.  90 capsule  1  . levothyroxine (SYNTHROID, LEVOTHROID) 50 MCG tablet Take 1 tablet (50 mcg total) by mouth daily.  90 tablet  1  . lisinopril (PRINIVIL,ZESTRIL) 2.5 MG tablet Take 1 tablet (2.5 mg total) by mouth daily.  90 tablet  1  . Multiple Vitamins-Minerals (MULTIVITAMIN WITH MINERALS) tablet Take 1 tablet by mouth daily.        Marland Kitchen omeprazole (PRILOSEC OTC) 20 MG tablet Take 20 mg by mouth daily.      . naproxen sodium (ANAPROX) 220 MG tablet Take 220 mg by mouth as needed. Aleve.       No current facility-administered medications on file prior to visit.    BP 140/80  Pulse 80  Temp(Src) 98 F (36.7 C) (Oral)  Resp 16  Ht 5\' 6"  (1.676 m)  Wt 169 lb 1.3 oz (76.694 kg)  BMI 27.30 kg/m2  SpO2 98%       Objective:   Physical Exam  Constitutional: She is oriented to person, place, and time. She appears well-developed and well-nourished. No distress.  HENT:    Head: Normocephalic and atraumatic.  Cardiovascular: Normal rate and regular rhythm.   No murmur heard. Pulmonary/Chest: Effort normal and breath sounds normal. No respiratory distress. She has no wheezes. She has no rales.  Abdominal: Soft. There is tenderness in the right lower quadrant, suprapubic area and left lower quadrant.  Genitourinary:  No external hemorrhoids noted.  No internal hemorrhoids noted. Scant blood noted in rectal vault. Heme positive  Neurological: She is alert and oriented to person, place, and time.  Psychiatric: She has a normal mood and affect. Her behavior is normal. Judgment and thought content normal.          Assessment & Plan:

## 2013-10-24 NOTE — Assessment & Plan Note (Signed)
Stable, monitor.  °

## 2013-10-24 NOTE — Assessment & Plan Note (Signed)
+   abdominal tenderness, no further bloody stools but faint blood in rectal vault on exam. Obtain stat cbc and a CT abdomen and pelvis. Suspect colitis. She is advised to go to the ED if she develops recurrent BRBPR of black tarry stools.

## 2013-10-24 NOTE — Assessment & Plan Note (Signed)
Stable on current meds. Continue same, obtain bmet.  

## 2013-10-24 NOTE — Assessment & Plan Note (Signed)
Stable on synthroid, obtain tsh. Continue same.

## 2013-10-24 NOTE — Telephone Encounter (Signed)
Notified pt and she voices understanding and is agreeable to proceed with referral. 

## 2013-10-24 NOTE — Patient Instructions (Addendum)
Please complete your lab work prior to leaving. Complete CT on the first floor. Go to ER if you develop fever, worsening abdominal pain or recurrent rectal bleeding.  Follow up in 1 week.

## 2013-10-24 NOTE — Progress Notes (Signed)
Pre visit review using our clinic review tool, if applicable. No additional management support is needed unless otherwise documented below in the visit note. 

## 2013-10-24 NOTE — Telephone Encounter (Signed)
Pt wants to be called on her cell# with CT result.  Please advise.

## 2013-10-24 NOTE — Telephone Encounter (Signed)
Ct is negative for colitis or bowel changes.  Blood count is normal. I would recommend referral to GI and will arrange.

## 2013-10-25 ENCOUNTER — Encounter: Payer: Self-pay | Admitting: Gastroenterology

## 2013-10-25 ENCOUNTER — Encounter: Payer: Self-pay | Admitting: Family

## 2013-10-25 LAB — BASIC METABOLIC PANEL WITH GFR
BUN: 22 mg/dL (ref 6–23)
CHLORIDE: 102 meq/L (ref 96–112)
CO2: 32 mEq/L (ref 19–32)
Calcium: 9.4 mg/dL (ref 8.4–10.5)
Creat: 0.73 mg/dL (ref 0.50–1.10)
GFR, EST NON AFRICAN AMERICAN: 80 mL/min
GFR, Est African American: >89 mL/min
Glucose, Bld: 103 mg/dL — ABNORMAL HIGH (ref 70–99)
POTASSIUM: 3.9 meq/L (ref 3.5–5.3)
SODIUM: 141 meq/L (ref 135–145)

## 2013-10-25 LAB — TSH: TSH: 0.778 u[IU]/mL (ref 0.350–4.500)

## 2013-10-31 ENCOUNTER — Ambulatory Visit: Payer: Medicare Other | Admitting: Family

## 2013-11-21 ENCOUNTER — Ambulatory Visit (INDEPENDENT_AMBULATORY_CARE_PROVIDER_SITE_OTHER): Payer: Medicare Other

## 2013-11-21 ENCOUNTER — Other Ambulatory Visit: Payer: Self-pay | Admitting: Dermatology

## 2013-11-21 VITALS — BP 138/76 | HR 69 | Resp 12

## 2013-11-21 DIAGNOSIS — M79609 Pain in unspecified limb: Secondary | ICD-10-CM

## 2013-11-21 DIAGNOSIS — S90129A Contusion of unspecified lesser toe(s) without damage to nail, initial encounter: Secondary | ICD-10-CM

## 2013-11-21 DIAGNOSIS — M2040 Other hammer toe(s) (acquired), unspecified foot: Secondary | ICD-10-CM

## 2013-11-21 DIAGNOSIS — B351 Tinea unguium: Secondary | ICD-10-CM

## 2013-11-21 NOTE — Progress Notes (Signed)
   Subjective:    Patient ID: Mallory Hardy, female    DOB: 12/20/1934, 79 y.o.   MRN: [id number]  HPI '' LT FOOT GREAT TOENAIL IS HURTING FOR ABOUT 3 MONTHS WHEN WEARING SHOES. TRIED NO TREATMENT.''    Review of Systems  All other systems reviewed and are negative.       Objective:   Physical Exam Neurovascular status is intact pedal pulses palpable bilateral epicritic and progressive sensations intact. The left great toe is examined and is significant longer than the adjacent digits. On exam there is no pain on direct palpation of the nail plate which is slightly thickened and yellowed there is no pain on lateral or medial compression however significant tenderness on palpation distal tuft of the hallux. Shoes are examined and in fact the toe was hitting the ends of her shoes on the shoe current shoes and other shoes she wears. Patient is has mild arthropathy digital contractures at her foot is flattened over the years causing the foot to spread and enlarge in size. Patient needs to adjust her shoe size appropriate       Assessment & Plan:  Assessment contusion of toe possible secondary onychomycosis and dystrophy of nail plate also digital contracture hammertoe adjacent digits. The contusion secondary to inadequate shoe size to be corrected with new shoes in the larger longer size and preferably and Oxford type shoe with laser Velcro closure to maintain appropriate fit. Followup in 3 months if no improvement  Harriet Masson DPM

## 2013-11-21 NOTE — Patient Instructions (Signed)
Patient is a long great toe comparison to the adjacent digits which are somewhat contracted and hammertoe fashion. As a result patient's great toe is hitting against the end of her shoes/the current shoe shoes or what she is wearing may be too short and causing contusion to the distal tuft of the great. Recommendation is shoes preferably a lace up or Oxford type shoe with appropriate length allowing one thumbs with length at the end of the toe. If not improved after wearing larger shoes for 2-3 months followup at the office as needed. Some thickening and dystrophy of the nail may be secondary to the mild contusion of the toe itself. This may resolve over time with changes in shoe size

## 2013-11-27 ENCOUNTER — Ambulatory Visit: Payer: Medicare Other | Admitting: Gastroenterology

## 2013-12-18 ENCOUNTER — Encounter: Payer: Self-pay | Admitting: Gastroenterology

## 2013-12-18 ENCOUNTER — Ambulatory Visit (INDEPENDENT_AMBULATORY_CARE_PROVIDER_SITE_OTHER): Payer: Medicare Other | Admitting: Gastroenterology

## 2013-12-18 VITALS — BP 110/72 | HR 72 | Ht 66.0 in | Wt 167.0 lb

## 2013-12-18 DIAGNOSIS — K625 Hemorrhage of anus and rectum: Secondary | ICD-10-CM

## 2013-12-18 NOTE — Patient Instructions (Signed)
Go to the basement and pick up your lab kit Please return within 2 weeks

## 2013-12-18 NOTE — Assessment & Plan Note (Signed)
Several hour history of limited rectal bleeding consisting of bright red blood, no change in hemoglobin.  Episodes may have been due to diverticular bleeding.  Ischemic colitis is also a possibility.  Hemorrhoidal bleeding is not often associated with lower abdominal pain.  Recommendations #1 stool Hemoccults; if negative no further GI workup at this time

## 2013-12-18 NOTE — Progress Notes (Signed)
_                                                                                                            GI procedures 1/12 colonoscopy demonstrated internal hemorrhoids 08/2006 diverticulosis    History of Present Illness:  Pleasant 79 year old white female referred for evaluation of rectal bleeding.  On January 7 she had several bloody bowel movements.  These were accompanied by crampy lower abdominal pain.  This lasted about 6 hours.  Symptoms spontaneously  subsided and she has had no recurrences.  She has a history of diverticulosis and internal hemorrhoids.  Last exam was 2012.  She denies change of bowel habits.  Hemoglobin was 13.3.    Past Medical History  Diagnosis Date  . Urinary tract infection   . Thyroid disease     hypothyroidism  . Migraine headache   . Renal calculus   . Allergy     seasonal  . Anxiety   . Depression   . GERD (gastroesophageal reflux disease)   . Constipation, chronic   . Diverticulosis   . Hypertension   . Skin cancer     squamous cell   Past Surgical History  Procedure Laterality Date  . Thyroidectomy  1959    goiter  . Abdominal exploration surgery  1959  . Rotator cuff repair  2007  . Bilateral oophorectomy  2003  . Abdominal hysterectomy  1978  . Cataract extraction Bilateral    family history includes Atrial fibrillation in her sister; Hypertension in her mother. There is no history of Cancer, Diabetes, Heart attack, Hyperlipidemia, or Sudden death. Current Outpatient Prescriptions  Medication Sig Dispense Refill  . aspirin 81 MG EC tablet Take 1 tablet 3 x weekly.      Marland Kitchen b complex vitamins tablet Take 1 tablet by mouth daily.        . Biotin 5000 MCG CAPS Take 1 capsule by mouth daily.      . cholecalciferol (VITAMIN D) 1000 UNITS tablet Take 1,000 Units by mouth daily.      . diclofenac sodium (VOLTAREN) 1 % GEL Apply topically. Apply 3 times a day for 1 week as needed to the neck.       . diphenhydrAMINE  (BENADRYL) 25 MG tablet Take 25 mg by mouth every morning. Allergies       . hydrochlorothiazide (MICROZIDE) 12.5 MG capsule Take 1 capsule (12.5 mg total) by mouth daily.  90 capsule  1  . levothyroxine (SYNTHROID, LEVOTHROID) 50 MCG tablet Take 1 tablet (50 mcg total) by mouth daily.  90 tablet  1  . lisinopril (PRINIVIL,ZESTRIL) 2.5 MG tablet Take 1 tablet (2.5 mg total) by mouth daily.  90 tablet  1  . Multiple Vitamins-Minerals (MULTIVITAMIN WITH MINERALS) tablet Take 1 tablet by mouth daily.        . naproxen sodium (ANAPROX) 220 MG tablet Take 220 mg by mouth as needed. Aleve.      Marland Kitchen omeprazole (PRILOSEC OTC) 20 MG tablet Take 20 mg by mouth daily.  No current facility-administered medications for this visit.   Allergies as of 12/18/2013  . (No Known Allergies)    reports that she has never smoked. She has never used smokeless tobacco. She reports that she does not drink alcohol or use illicit drugs.     Review of Systems: Pertinent positive and negative review of systems were noted in the above HPI section. All other review of systems were otherwise negative.  Vital signs were reviewed in today's medical record Physical Exam: General: Well developed , well nourished, no acute distress Skin: anicteric Head: Normocephalic and atraumatic Eyes:  sclerae anicteric, EOMI Ears: Normal auditory acuity Mouth: No deformity or lesions Neck: Supple, no masses or thyromegaly Lungs: Clear throughout to auscultation Heart: Regular rate and rhythm; no murmurs, rubs or bruits Abdomen: Soft, non tender and non distended. No masses, hepatosplenomegaly or hernias noted. Normal Bowel sounds Rectal:deferred Musculoskeletal: Symmetrical with no gross deformities  Skin: No lesions on visible extremities Pulses:  Normal pulses noted Extremities: No clubbing, cyanosis, edema or deformities noted Neurological: Alert oriented x 4, grossly nonfocal Cervical Nodes:  No significant cervical  adenopathy Inguinal Nodes: No significant inguinal adenopathy Psychological:  Alert and cooperative. Normal mood and affect  See Assessment and Plan under Problem List

## 2013-12-21 ENCOUNTER — Other Ambulatory Visit (INDEPENDENT_AMBULATORY_CARE_PROVIDER_SITE_OTHER): Payer: Medicare Other

## 2013-12-21 ENCOUNTER — Other Ambulatory Visit: Payer: Self-pay | Admitting: *Deleted

## 2013-12-21 DIAGNOSIS — K625 Hemorrhage of anus and rectum: Secondary | ICD-10-CM

## 2013-12-21 LAB — FECAL OCCULT BLOOD, IMMUNOCHEMICAL: Fecal Occult Bld: NEGATIVE

## 2013-12-24 ENCOUNTER — Telehealth: Payer: Self-pay

## 2013-12-24 NOTE — Progress Notes (Signed)
Quick Note:  Please inform the patient that Hemoccults were negative. Plan no further GI workup. ______ 

## 2014-01-16 ENCOUNTER — Other Ambulatory Visit: Payer: Self-pay | Admitting: Family

## 2014-04-22 ENCOUNTER — Other Ambulatory Visit: Payer: Self-pay | Admitting: Family

## 2014-04-22 NOTE — Telephone Encounter (Signed)
Last refill 01/17/14, #90; refill sent. Pt last seen in January and has not future appts on file. When should pt be seen for f/u?

## 2014-04-22 NOTE — Telephone Encounter (Signed)
OK to send 30 tabs.  Due for follow up please.

## 2014-04-23 NOTE — Telephone Encounter (Signed)
Please call to schedule follow-up.

## 2014-04-23 NOTE — Telephone Encounter (Signed)
Left detailed message informing patient of medication refill and to call our office to schedule follow up.

## 2014-04-24 NOTE — Telephone Encounter (Signed)
Left message for patient to return my call.

## 2014-04-25 NOTE — Telephone Encounter (Signed)
Left message for patient to return my call.

## 2014-05-01 ENCOUNTER — Telehealth: Payer: Self-pay | Admitting: Family

## 2014-05-01 ENCOUNTER — Ambulatory Visit (INDEPENDENT_AMBULATORY_CARE_PROVIDER_SITE_OTHER): Payer: Medicare Other | Admitting: Family

## 2014-05-01 ENCOUNTER — Encounter: Payer: Self-pay | Admitting: Family

## 2014-05-01 VITALS — BP 122/80 | HR 68 | Temp 98.0°F | Resp 16 | Ht 66.0 in | Wt 168.1 lb

## 2014-05-01 DIAGNOSIS — I10 Essential (primary) hypertension: Secondary | ICD-10-CM

## 2014-05-01 DIAGNOSIS — E039 Hypothyroidism, unspecified: Secondary | ICD-10-CM

## 2014-05-01 DIAGNOSIS — Z23 Encounter for immunization: Secondary | ICD-10-CM

## 2014-05-01 LAB — BASIC METABOLIC PANEL
BUN: 22 mg/dL (ref 6–23)
CALCIUM: 9.5 mg/dL (ref 8.4–10.5)
CHLORIDE: 105 meq/L (ref 96–112)
CO2: 28 mEq/L (ref 19–32)
CREATININE: 0.82 mg/dL (ref 0.50–1.10)
Glucose, Bld: 106 mg/dL — ABNORMAL HIGH (ref 70–99)
Potassium: 4.6 mEq/L (ref 3.5–5.3)
Sodium: 139 mEq/L (ref 135–145)

## 2014-05-01 LAB — TSH: TSH: 0.771 u[IU]/mL (ref 0.350–4.500)

## 2014-05-01 MED ORDER — HYDROCHLOROTHIAZIDE 12.5 MG PO CAPS: 12.5000 mg | ORAL_CAPSULE | Freq: Every day | ORAL | Status: DC

## 2014-05-01 MED ORDER — LEVOTHYROXINE SODIUM 50 MCG PO TABS: 50.0000 ug | ORAL_TABLET | Freq: Every day | ORAL | Status: DC

## 2014-05-01 MED ORDER — LISINOPRIL 2.5 MG PO TABS: 2.5000 mg | ORAL_TABLET | Freq: Every day | ORAL | Status: DC

## 2014-05-01 NOTE — Patient Instructions (Addendum)
Pt bp controlled. Continue current medicines. When gets occasional mild ha. Asked her to check her bp and document. Explained if bp rise significant may be cause of ha. Continue current thyroid medications. Follow up in 3 months wellness exam or prn. Get labs today. Sent rx refill today.

## 2014-05-01 NOTE — Assessment & Plan Note (Signed)
Pt last tsh was normal. That was 6 months ago. Continue current dose.

## 2014-05-01 NOTE — Progress Notes (Signed)
Subjective:    Patient ID: Mallory Hardy, female    DOB: 1935/08/26, 79 y.o.   MRN: [id number]  HPI  Pt here for refill of meds. Bp meds. She does not check her bp although has monitor.  BP Readings from Last 3 Encounters:  05/01/14 122/80  12/18/13 110/72  11/21/13 138/76   Occasional mild ha but no associated neuro signs or symptom. No cardiac symptoms.  HA she states mild and will rest drink coke and ha resolves. She states very rare ha and mild side.  Pt has hypothyroid. Her last tsh was within normal limits 6 month ago.    Review of Systems See HPI  Past Medical History  Diagnosis Date  . Urinary tract infection   . Thyroid disease     hypothyroidism  . Migraine headache   . Renal calculus   . Allergy     seasonal  . Anxiety   . Depression   . GERD (gastroesophageal reflux disease)   . Constipation, chronic   . Diverticulosis   . Hypertension   . Skin cancer     squamous cell    History   Social History  . Marital Status: Widowed    Spouse Name: N/A    Number of Children: 2  . Years of Education: N/A   Occupational History  . Retired    Social History Main Topics  . Smoking status: Never Smoker   . Smokeless tobacco: Never Used  . Alcohol Use: No  . Drug Use: No  . Sexual Activity: Not on file   Other Topics Concern  . Not on file   Social History Narrative  . No narrative on file    Past Surgical History  Procedure Laterality Date  . Thyroidectomy  1959    goiter  . Abdominal exploration surgery  1959  . Rotator cuff repair  2007  . Bilateral oophorectomy  2003  . Abdominal hysterectomy  1978  . Cataract extraction Bilateral     Family History  Problem Relation Age of Onset  . Cancer Neg Hx   . Diabetes Neg Hx   . Heart attack Neg Hx   . Hyperlipidemia Neg Hx   . Sudden death Neg Hx   . Hypertension Mother   . Atrial fibrillation Sister     No Known Allergies  Current Outpatient Prescriptions on File Prior to Visit    Medication Sig Dispense Refill  . aspirin 81 MG EC tablet Take 1 tablet 3 x weekly.      Marland Kitchen b complex vitamins tablet Take 1 tablet by mouth daily.        . Biotin 5000 MCG CAPS Take 1 capsule by mouth daily.      . cholecalciferol (VITAMIN D) 1000 UNITS tablet Take 1,000 Units by mouth daily.      . diclofenac sodium (VOLTAREN) 1 % GEL Apply topically. Apply as needed to the neck.      . Multiple Vitamins-Minerals (MULTIVITAMIN WITH MINERALS) tablet Take 1 tablet by mouth daily.        . naproxen sodium (ANAPROX) 220 MG tablet Take 220 mg by mouth as needed. Aleve.       No current facility-administered medications on file prior to visit.    BP 122/80  Pulse 68  Temp(Src) 98 F (36.7 C) (Oral)  Resp 16  Ht 5\' 6"  (1.676 m)  Wt 168 lb 1.3 oz (76.241 kg)  BMI 27.14 kg/m2  SpO2 97%  Objective:   Physical Exam  Constitutional: She is oriented to person, place, and time. She appears well-developed and well-nourished. No distress.  HENT:  Head: Normocephalic and atraumatic.  Cardiovascular: Normal rate and regular rhythm.   No murmur heard. Pulmonary/Chest: Effort normal and breath sounds normal. No respiratory distress. She has no wheezes. She has no rales. She exhibits no tenderness.  Musculoskeletal: She exhibits no edema.  Neurological: She is alert and oriented to person, place, and time.  Psychiatric: She has a normal mood and affect. Her behavior is normal. Judgment and thought content normal.          Assessment & Plan:  Patient seen along with Evern Core PA-C who is currently in orientation for training purposes. I have personally examined pt and agree with assessment and plan.

## 2014-05-01 NOTE — Progress Notes (Signed)
Pre visit review using our clinic review tool, if applicable. No additional management support is needed unless otherwise documented below in the visit note. 

## 2014-05-01 NOTE — Assessment & Plan Note (Signed)
Pt bp is controlled today. When she gets rare transient mild ha asked her to check her bp. Continue current meds and get bmet today.

## 2014-05-01 NOTE — Telephone Encounter (Signed)
Relevant patient education mailed to patient.  

## 2014-05-02 ENCOUNTER — Encounter: Payer: Self-pay | Admitting: Family

## 2014-08-07 ENCOUNTER — Encounter: Payer: Self-pay | Admitting: Family

## 2014-08-07 ENCOUNTER — Ambulatory Visit (INDEPENDENT_AMBULATORY_CARE_PROVIDER_SITE_OTHER): Payer: Medicare Other | Admitting: Family

## 2014-08-07 VITALS — BP 135/63 | HR 64 | Temp 97.7°F | Ht 65.75 in | Wt 171.2 lb

## 2014-08-07 DIAGNOSIS — E039 Hypothyroidism, unspecified: Secondary | ICD-10-CM

## 2014-08-07 DIAGNOSIS — Z1239 Encounter for other screening for malignant neoplasm of breast: Secondary | ICD-10-CM

## 2014-08-07 DIAGNOSIS — Z0000 Encounter for general adult medical examination without abnormal findings: Secondary | ICD-10-CM

## 2014-08-07 DIAGNOSIS — Z23 Encounter for immunization: Secondary | ICD-10-CM

## 2014-08-07 DIAGNOSIS — I10 Essential (primary) hypertension: Secondary | ICD-10-CM

## 2014-08-07 DIAGNOSIS — M810 Age-related osteoporosis without current pathological fracture: Secondary | ICD-10-CM

## 2014-08-07 DIAGNOSIS — Z1231 Encounter for screening mammogram for malignant neoplasm of breast: Secondary | ICD-10-CM

## 2014-08-07 DIAGNOSIS — F341 Dysthymic disorder: Secondary | ICD-10-CM

## 2014-08-07 LAB — BASIC METABOLIC PANEL
BUN: 22 mg/dL (ref 6–23)
CALCIUM: 9.2 mg/dL (ref 8.4–10.5)
CO2: 26 meq/L (ref 19–32)
CREATININE: 0.9 mg/dL (ref 0.4–1.2)
Chloride: 105 mEq/L (ref 96–112)
GFR: 63.59 mL/min (ref >60.00)
Glucose, Bld: 91 mg/dL (ref 70–99)
Potassium: 3.6 mEq/L (ref 3.5–5.1)
SODIUM: 141 meq/L (ref 135–145)

## 2014-08-07 MED ORDER — ESCITALOPRAM OXALATE 10 MG PO TABS: ORAL_TABLET | ORAL | Status: DC

## 2014-08-07 NOTE — Progress Notes (Signed)
Pre visit review using our clinic review tool, if applicable. No additional management support is needed unless otherwise documented below in the visit note. 

## 2014-08-07 NOTE — Assessment & Plan Note (Signed)
Clinically stable on synthroid. Continue same, obtain tsh.

## 2014-08-07 NOTE — Assessment & Plan Note (Signed)
Deteriorated.  Trial of lexapro.  I instructed pt to start 1/2 tablet once daily for 1 week and then increase to a full tablet once daily on week two as tolerated.  We discussed common side effects such as nausea, drowsiness and weight gain.  Also discussed rare but serious side effect of suicide ideation.  Pt verbalizes understanding.  Plan follow up in 1 month to evaluate progress.

## 2014-08-07 NOTE — Assessment & Plan Note (Signed)
Stable on current meds.  Continue same. 

## 2014-08-07 NOTE — Progress Notes (Signed)
Patient ID: Mallory Hardy, female   DOB: 1935/07/13, 79 y.o.   MRN: [id number] Subjective:   Patient here for Medicare annual wellness visit and management of other chronic and acute problems.  Immunizations: due for flu shot Diet: reports diet is not good, too much junk food Exercise: not exercising Colonoscopy: due 2017 Dexa: due Pap Smear: hysterectomy Mammogram: due in November- had done last year at [facility]  HTN-  Maintained on lisinopril on microzide. Denies CP, SOB or swelling.   BP Readings from Last 3 Encounters:  08/07/14 135/63  05/01/14 122/80  12/18/13 110/72   Depression/anxiety-  Reports that her son is living with her.  This is stressful. Reports that she cries every day.  Feels "trapped" when she is in the house due to the clutter and finds the need to be out of the house as much as possible. She denies Suicide ideation.   Hypothyroid- maintained on synthroid.  Reports mild weight gain.  Wt Readings from Last 3 Encounters:  08/07/14 171 lb 3.2 oz (77.656 kg)  05/01/14 168 lb 1.3 oz (76.241 kg)  12/18/13 167 lb (75.751 kg)    Risk factors: HTN  Roster of Physicians Providing Medical Care to Patient: Dr. [name]- GI Dr. Blenda Mounts- podiatry Dr. Elvera Lennox, dermatology Dr. Digby-opthalmology  Advanced Directives- has a living will.  She reports granddaughter is her HCPOA  Activities of Daily Living  In your present state of health, do you have any difficulty performing the following activities? Preparing food and eating?: No  Bathing yourself: No  Getting dressed: No  Using the toilet:No  Moving around from place to place: No  In the past year have you fallen or had a near fall?:No     Home Safety: Has smoke detector and wears seat belts. No firearms. No excess sun exposure.  Diet and Exercise  Current exercise habits: none Dietary issues discussed: healthy diet   Depression Screen  (Note: if answer to either of the following is "Yes", then a more complete  depression screening is indicated)  Q1: Over the past two weeks, have you felt down, depressed or hopeless?yes Q2: Over the past two weeks, have you felt little interest or pleasure in doing things? no   The following portions of the patient's history were reviewed and updated as appropriate: allergies, current medications, past family history, past medical history, past social history, past surgical history and problem list.   Past Medical History  Diagnosis Date  . Urinary tract infection   . Thyroid disease     hypothyroidism  . Migraine headache   . Renal calculus   . Allergy     seasonal  . Anxiety   . Depression   . GERD (gastroesophageal reflux disease)   . Constipation, chronic   . Diverticulosis   . Hypertension   . Skin cancer     squamous cell    History   Social History  . Marital Status: Widowed    Spouse Name: N/A    Number of Children: 2  . Years of Education: N/A   Occupational History  . Retired    Social History Main Topics  . Smoking status: Never Smoker   . Smokeless tobacco: Never Used  . Alcohol Use: No  . Drug Use: No  . Sexual Activity: Not on file   Other Topics Concern  . Not on file   Social History Narrative  . No narrative on file    Past Surgical History  Procedure Laterality Date  .  Thyroidectomy  1959    goiter  . Abdominal exploration surgery  1959  . Rotator cuff repair  2007  . Bilateral oophorectomy  2003  . Abdominal hysterectomy  1978  . Cataract extraction Bilateral     Family History  Problem Relation Age of Onset  . Cancer Neg Hx   . Diabetes Neg Hx   . Heart attack Neg Hx   . Hyperlipidemia Neg Hx   . Sudden death Neg Hx   . Hypertension Mother   . Atrial fibrillation Sister     No Known Allergies  Current Outpatient Prescriptions on File Prior to Visit  Medication Sig Dispense Refill  . aspirin 81 MG EC tablet Take 1 tablet 3 x weekly.      Marland Kitchen b complex vitamins tablet Take 1 tablet by mouth daily.         . Biotin 5000 MCG CAPS Take 1 capsule by mouth daily.      . cholecalciferol (VITAMIN D) 1000 UNITS tablet Take 1,000 Units by mouth daily.      . diclofenac sodium (VOLTAREN) 1 % GEL Apply topically. Apply as needed to the neck.      . hydrochlorothiazide (MICROZIDE) 12.5 MG capsule Take 1 capsule (12.5 mg total) by mouth daily.  90 capsule  1  . levothyroxine (SYNTHROID, LEVOTHROID) 50 MCG tablet Take 1 tablet (50 mcg total) by mouth daily.  90 tablet  1  . lisinopril (PRINIVIL,ZESTRIL) 2.5 MG tablet Take 1 tablet (2.5 mg total) by mouth daily.  90 tablet  0  . Multiple Vitamins-Minerals (MULTIVITAMIN WITH MINERALS) tablet Take 1 tablet by mouth daily.        . naproxen sodium (ANAPROX) 220 MG tablet Take 220 mg by mouth as needed. Aleve.       No current facility-administered medications on file prior to visit.    BP 135/63  Pulse 64  Temp(Src) 97.7 F (36.5 C) (Oral)  Ht 5' 5.75" (1.67 m)  Wt 171 lb 3.2 oz (77.656 kg)  BMI 27.84 kg/m2  SpO2 99%     Objective:   Vision: see nursing Hearing: able to hear forced whisper at 6 feet Body mass index: Body mass index is 27.84 kg/(m^2).  Cognitive Impairment Assessment: cognition, memory and judgment appear normal.   Physical Exam  Constitutional: She is oriented to person, place, and time. She appears well-developed and well-nourished. No distress.  HENT:  Head: Normocephalic and atraumatic.  Right Ear: Tympanic membrane and ear canal normal.  Left Ear: Tympanic membrane and ear canal normal.  Mouth/Throat: Oropharynx is clear and moist.  Eyes: Pupils are equal, round, and reactive to light. No scleral icterus.  Neck: Normal range of motion. No thyromegaly present.  Cardiovascular: Normal rate and regular rhythm.   No murmur heard. Pulmonary/Chest: Effort normal and breath sounds normal. No respiratory distress. He has no wheezes. She has no rales. She exhibits no tenderness.  Abdominal: Soft. Bowel sounds are normal. He  exhibits no distension and no mass. There is no tenderness. There is no rebound and no guarding.  Musculoskeletal: She exhibits no edema.  Lymphadenopathy:    She has no cervical adenopathy.  Neurological: She is alert and oriented to person, place, and time. She exhibits normal muscle tone. Coordination normal.  Skin: Skin is warm and dry.  Psychiatric: She has a normal mood and affect. Her behavior is normal. Judgment and thought content normal.  Breasts: Examined lying Right: Without masses, retractions, discharge or axillary adenopathy.  Left: Without masses, retractions, discharge or axillary adenopathy.  Pelvic: deferred (hysterectomy)          Assessment & Plan:     Assessment:   Medicare wellness utd on preventive parameters     Plan:    During the course of the visit the patient was educated and counseled about appropriate screening and preventive services including:          Screening mammography  Bone densitometry screening  Nutrition counseling   Vaccines / LABS  Flu shot today  Patient Instructions (the written plan) was given to the patient.

## 2014-08-07 NOTE — Patient Instructions (Addendum)
Please complete lab work prior to leaving. Start lexapro 10mg , 1/2 tab by mouth once daily for 1 week, then increase to a full tab once daily on week two. You will be contacted re: bone density and mammogram.  Follow up in 1 month.

## 2014-08-08 ENCOUNTER — Encounter: Payer: Self-pay | Admitting: Family

## 2014-08-09 ENCOUNTER — Encounter: Payer: Self-pay | Admitting: Family

## 2014-08-09 LAB — TSH: TSH: 0.84 u[IU]/mL (ref 0.35–4.50)

## 2014-08-27 ENCOUNTER — Encounter: Payer: Self-pay | Admitting: Family

## 2014-09-03 ENCOUNTER — Other Ambulatory Visit: Payer: Self-pay | Admitting: Family

## 2014-09-09 ENCOUNTER — Ambulatory Visit (INDEPENDENT_AMBULATORY_CARE_PROVIDER_SITE_OTHER): Payer: Medicare Other | Admitting: Family

## 2014-09-09 ENCOUNTER — Encounter: Payer: Self-pay | Admitting: Family

## 2014-09-09 VITALS — BP 138/68 | HR 61 | Temp 97.5°F | Resp 18 | Ht 65.75 in | Wt 167.6 lb

## 2014-09-09 DIAGNOSIS — F341 Dysthymic disorder: Secondary | ICD-10-CM

## 2014-09-09 NOTE — Patient Instructions (Signed)
Please continue lexapro.  Past Medical History  Diagnosis Date  . Urinary tract infection   . Thyroid disease     hypothyroidism  . Migraine headache   . Renal calculus   . Allergy     seasonal  . Anxiety   . Depression   . GERD (gastroesophageal reflux disease)   . Constipation, chronic   . Diverticulosis   . Hypertension   . Skin cancer     squamous cell    History   Social History  . Marital Status: Widowed    Spouse Name: N/A    Number of Children: 2  . Years of Education: N/A   Occupational History  . Retired    Social History Main Topics  . Smoking status: Never Smoker   . Smokeless tobacco: Never Used  . Alcohol Use: No  . Drug Use: No  . Sexual Activity: Not on file   Other Topics Concern  . Not on file   Social History Narrative    Past Surgical History  Procedure Laterality Date  . Thyroidectomy  1959    goiter  . Abdominal exploration surgery  1959  . Rotator cuff repair  2007  . Bilateral oophorectomy  2003  . Abdominal hysterectomy  1978  . Cataract extraction Bilateral     Family History  Problem Relation Age of Onset  . Cancer Neg Hx   . Diabetes Neg Hx   . Heart attack Neg Hx   . Hyperlipidemia Neg Hx   . Sudden death Neg Hx   . Hypertension Mother   . Atrial fibrillation Sister     No Known Allergies  Current Outpatient Prescriptions on File Prior to Visit  Medication Sig Dispense Refill  . aspirin 81 MG EC tablet Take 1 tablet 3 x weekly.    Marland Kitchen b complex vitamins tablet Take 1 tablet by mouth daily.      . Biotin 5000 MCG CAPS Take 1 capsule by mouth daily.    . cholecalciferol (VITAMIN D) 1000 UNITS tablet Take 1,000 Units by mouth daily.    . diclofenac sodium (VOLTAREN) 1 % GEL Apply topically. Apply as needed to the neck.    Marland Kitchen escitalopram (LEXAPRO) 10 MG tablet Take 1 tablet (10 mg total) by mouth daily. 30 tablet 3  . hydrochlorothiazide (MICROZIDE) 12.5 MG capsule Take 1 capsule (12.5 mg total) by mouth daily. 90  capsule 1  . levothyroxine (SYNTHROID, LEVOTHROID) 50 MCG tablet Take 1 tablet (50 mcg total) by mouth daily. 90 tablet 1  . lisinopril (PRINIVIL,ZESTRIL) 2.5 MG tablet Take 1 tablet (2.5 mg total) by mouth daily. 90 tablet 0  . Multiple Vitamins-Minerals (MULTIVITAMIN WITH MINERALS) tablet Take 1 tablet by mouth daily.      . naproxen sodium (ANAPROX) 220 MG tablet Take 220 mg by mouth as needed. Aleve.     No current facility-administered medications on file prior to visit.    BP 138/68 mmHg  Pulse 61  Temp(Src) 97.5 F (36.4 C) (Oral)  Resp 18  Ht 5' 5.75" (1.67 m)  Wt 167 lb 9.6 oz (76.023 kg)  BMI 27.26 kg/m2  SpO2 99%

## 2014-09-09 NOTE — Progress Notes (Signed)
Subjective:    Patient ID: Mallory Hardy, female    DOB: November 11, 1934, 79 y.o.   MRN: [id number]  HPI   Mallory Hardy is a 79 yr old female who presents today for follow up of her depression.  1 month ago she was placed on lexapro.  She reports feeling much better since starting lexapro. Feels like she is better able to let things that bothered her previously just "roll off."  Denies any side effects from lexapro.     Review of Systems See HPI  Past Medical History  Diagnosis Date  . Urinary tract infection   . Thyroid disease     hypothyroidism  . Migraine headache   . Renal calculus   . Allergy     seasonal  . Anxiety   . Depression   . GERD (gastroesophageal reflux disease)   . Constipation, chronic   . Diverticulosis   . Hypertension   . Skin cancer     squamous cell    History   Social History  . Marital Status: Widowed    Spouse Name: N/A    Number of Children: 2  . Years of Education: N/A   Occupational History  . Retired    Social History Main Topics  . Smoking status: Never Smoker   . Smokeless tobacco: Never Used  . Alcohol Use: No  . Drug Use: No  . Sexual Activity: Not on file   Other Topics Concern  . Not on file   Social History Narrative    Past Surgical History  Procedure Laterality Date  . Thyroidectomy  1959    goiter  . Abdominal exploration surgery  1959  . Rotator cuff repair  2007  . Bilateral oophorectomy  2003  . Abdominal hysterectomy  1978  . Cataract extraction Bilateral     Family History  Problem Relation Age of Onset  . Cancer Neg Hx   . Diabetes Neg Hx   . Heart attack Neg Hx   . Hyperlipidemia Neg Hx   . Sudden death Neg Hx   . Hypertension Mother   . Atrial fibrillation Sister     No Known Allergies  Current Outpatient Prescriptions on File Prior to Visit  Medication Sig Dispense Refill  . aspirin 81 MG EC tablet Take 1 tablet 3 x weekly.    Marland Kitchen b complex vitamins tablet Take 1 tablet by mouth daily.       . Biotin 5000 MCG CAPS Take 1 capsule by mouth daily.    . cholecalciferol (VITAMIN D) 1000 UNITS tablet Take 1,000 Units by mouth daily.    . diclofenac sodium (VOLTAREN) 1 % GEL Apply topically. Apply as needed to the neck.    Marland Kitchen escitalopram (LEXAPRO) 10 MG tablet Take 1 tablet (10 mg total) by mouth daily. 30 tablet 3  . hydrochlorothiazide (MICROZIDE) 12.5 MG capsule Take 1 capsule (12.5 mg total) by mouth daily. 90 capsule 1  . levothyroxine (SYNTHROID, LEVOTHROID) 50 MCG tablet Take 1 tablet (50 mcg total) by mouth daily. 90 tablet 1  . lisinopril (PRINIVIL,ZESTRIL) 2.5 MG tablet Take 1 tablet (2.5 mg total) by mouth daily. 90 tablet 0  . Multiple Vitamins-Minerals (MULTIVITAMIN WITH MINERALS) tablet Take 1 tablet by mouth daily.      . naproxen sodium (ANAPROX) 220 MG tablet Take 220 mg by mouth as needed. Aleve.     No current facility-administered medications on file prior to visit.    BP 138/68 mmHg  Pulse 61  Temp(Src) 97.5 F (36.4 C) (Oral)  Resp 18  Ht 5' 5.75" (1.67 m)  Wt 167 lb 9.6 oz (76.023 kg)  BMI 27.26 kg/m2  SpO2 99%       Objective:   Physical Exam  Constitutional: She is oriented to person, place, and time. She appears well-developed and well-nourished. No distress.  Neurological: She is alert and oriented to person, place, and time.  Psychiatric: She has a normal mood and affect. Her behavior is normal. Judgment and thought content normal.          Assessment & Plan:

## 2014-09-09 NOTE — Progress Notes (Signed)
Pre visit review using our clinic review tool, if applicable. No additional management support is needed unless otherwise documented below in the visit note. 

## 2014-09-14 NOTE — Assessment & Plan Note (Signed)
Improved on lexapro. We discussed the possibility of her coming off the med, and I told her that I think she is feeling better because of the lexapro and recommend at least 6 months of therapy before we attempt to wean her off. She is agreeable to this plan.  15 minutes spent with pt today. >50% of this time was spent counseling pt on anxiety/depression.

## 2014-10-23 ENCOUNTER — Telehealth: Payer: Self-pay | Admitting: Family

## 2014-10-23 DIAGNOSIS — M8580 Other specified disorders of bone density and structure, unspecified site: Secondary | ICD-10-CM | POA: Insufficient documentation

## 2014-10-23 NOTE — Telephone Encounter (Signed)
Please let pt know bone density shows osteopenia. Please continue calcium, vit D and regular weight bearing exercise as tolerated. Bone density should be repeated in 2 years.

## 2014-10-23 NOTE — Telephone Encounter (Signed)
Left message on home number to return my call.

## 2014-10-25 NOTE — Telephone Encounter (Signed)
Left detailed message on home# and to call if any questions. 

## 2014-11-01 ENCOUNTER — Encounter: Payer: Self-pay | Admitting: Family

## 2014-11-01 ENCOUNTER — Other Ambulatory Visit: Payer: Self-pay | Admitting: Medical

## 2014-11-01 DIAGNOSIS — M8580 Other specified disorders of bone density and structure, unspecified site: Secondary | ICD-10-CM

## 2014-11-01 MED ORDER — CALCIUM CARBONATE-VITAMIN D 600-400 MG-UNIT PO TABS: 1.0000 | ORAL_TABLET | Freq: Every day | ORAL | Status: AC

## 2014-11-04 ENCOUNTER — Encounter: Payer: Self-pay | Admitting: Family

## 2014-11-05 ENCOUNTER — Encounter: Payer: Self-pay | Admitting: Family

## 2014-11-07 ENCOUNTER — Encounter: Payer: Self-pay | Admitting: Family

## 2014-11-07 ENCOUNTER — Ambulatory Visit (INDEPENDENT_AMBULATORY_CARE_PROVIDER_SITE_OTHER): Payer: Medicare Other | Admitting: Family

## 2014-11-07 VITALS — BP 148/72 | HR 70 | Temp 99.6°F | Resp 18 | Ht 65.75 in | Wt 166.4 lb

## 2014-11-07 DIAGNOSIS — I10 Essential (primary) hypertension: Secondary | ICD-10-CM | POA: Diagnosis not present

## 2014-11-07 DIAGNOSIS — J209 Acute bronchitis, unspecified: Secondary | ICD-10-CM

## 2014-11-07 MED ORDER — LISINOPRIL 2.5 MG PO TABS: 2.5000 mg | ORAL_TABLET | Freq: Every day | ORAL | Status: DC

## 2014-11-07 MED ORDER — LEVOTHYROXINE SODIUM 50 MCG PO TABS: 50.0000 ug | ORAL_TABLET | Freq: Every day | ORAL | Status: DC

## 2014-11-07 MED ORDER — AZITHROMYCIN 250 MG PO TABS: ORAL_TABLET | ORAL | Status: DC

## 2014-11-07 MED ORDER — BENZONATATE 100 MG PO CAPS: 100.0000 mg | ORAL_CAPSULE | Freq: Three times a day (TID) | ORAL | Status: DC | PRN

## 2014-11-07 NOTE — Progress Notes (Signed)
Pre visit review using our clinic review tool, if applicable. No additional management support is needed unless otherwise documented below in the visit note. 

## 2014-11-07 NOTE — Progress Notes (Signed)
Subjective:    Patient ID: Mallory Hardy, female    DOB: [birth year], 80 y.o.   MRN: [id number]  HPI Mallory Hardy is here today for cough, chest congestion, and hoarseness for 2 days. Throat is scratchy. Feels fatigued, has little appetite. Cough is non-productive. Denies nasal congestion, sneezing, nasal pressure. Friend recently diagnosed with pneumonia.   Review of Systems  Constitutional: Positive for fatigue. Negative for chills.  HENT: Negative for ear pain, sinus pressure and sneezing.   Respiratory: Positive for chest tightness. Negative for shortness of breath.    Past Medical History  Diagnosis Date  . Urinary tract infection   . Thyroid disease     hypothyroidism  . Migraine headache   . Renal calculus   . Allergy     seasonal  . Anxiety   . Depression   . GERD (gastroesophageal reflux disease)   . Constipation, chronic   . Diverticulosis   . Hypertension   . Skin cancer     squamous cell    History   Social History  . Marital Status: Widowed    Spouse Name: N/A    Number of Children: 2  . Years of Education: N/A   Occupational History  . Retired    Social History Main Topics  . Smoking status: Never Smoker   . Smokeless tobacco: Never Used  . Alcohol Use: No  . Drug Use: No  . Sexual Activity: Not on file   Other Topics Concern  . Not on file   Social History Narrative    Past Surgical History  Procedure Laterality Date  . Thyroidectomy  1959    goiter  . Abdominal exploration surgery  1959  . Rotator cuff repair  2007  . Bilateral oophorectomy  2003  . Abdominal hysterectomy  1978  . Cataract extraction Bilateral     Family History  Problem Relation Age of Onset  . Cancer Neg Hx   . Diabetes Neg Hx   . Heart attack Neg Hx   . Hyperlipidemia Neg Hx   . Sudden death Neg Hx   . Hypertension Mother   . Atrial fibrillation Sister     No Known Allergies  Current Outpatient Prescriptions on File Prior to Visit  Medication Sig  Dispense Refill  . aspirin 81 MG EC tablet Take 1 tablet 3 x weekly.    Marland Kitchen b complex vitamins tablet Take 1 tablet by mouth daily.      . Biotin 5000 MCG CAPS Take 1 capsule by mouth daily.    . Calcium Carbonate-Vitamin D (CALTRATE 600+D) 600-400 MG-UNIT per tablet Take 1 tablet by mouth daily.    . cholecalciferol (VITAMIN D) 1000 UNITS tablet Take 6,000 Units by mouth daily.     . diclofenac sodium (VOLTAREN) 1 % GEL Apply topically. Apply as needed to the neck.    Marland Kitchen escitalopram (LEXAPRO) 10 MG tablet Take 1 tablet (10 mg total) by mouth daily. 30 tablet 3  . hydrochlorothiazide (MICROZIDE) 12.5 MG capsule TAKE ONE CAPSULE BY MOUTH ONCE DAILY 90 capsule 0  . levothyroxine (SYNTHROID, LEVOTHROID) 50 MCG tablet Take 1 tablet (50 mcg total) by mouth daily. 90 tablet 1  . lisinopril (PRINIVIL,ZESTRIL) 2.5 MG tablet Take 1 tablet (2.5 mg total) by mouth daily. 90 tablet 0  . Multiple Vitamins-Minerals (MULTIVITAMIN WITH MINERALS) tablet Take 1 tablet by mouth daily.      . naproxen sodium (ANAPROX) 220 MG tablet Take 220 mg by mouth as needed. Aleve.  No current facility-administered medications on file prior to visit.    BP 148/72 mmHg  Pulse 70  Temp(Src) 99.6 F (37.6 C) (Oral)  Resp 18  Ht 5' 5.75" (1.67 m)  Wt 166 lb 6.4 oz (75.479 kg)  BMI 27.06 kg/m2  SpO2 97%      Objective:   Physical Exam  Constitutional: She is oriented to person, place, and time. She appears well-developed and well-nourished.  HENT:  Head: Normocephalic and atraumatic.  Right Ear: Tympanic membrane normal.  Left Ear: Tympanic membrane normal.  Mouth/Throat: Posterior oropharyngeal erythema present. No oropharyngeal exudate or posterior oropharyngeal edema.  Her cough sounds congested.  Cardiovascular: Normal rate, regular rhythm and normal heart sounds.  Exam reveals no gallop and no friction rub.   No murmur heard. Pulmonary/Chest: Effort normal and breath sounds normal. No respiratory distress.  She has no wheezes. She has no rales.  Neurological: She is alert and oriented to person, place, and time.  Skin: Skin is warm and dry.  Psychiatric: She has a normal mood and affect. Her behavior is normal. Judgment and thought content normal.          Assessment & Plan:  Patient seen along with [facility] NP-student.  I have personally seen and examined patient and agree with Ms. Mallory Hardy's assessment and plan- Debbrah Alar NP

## 2014-11-07 NOTE — Assessment & Plan Note (Signed)
Rx for Cipro and Tessalon. Instructed her to stay hydrated.

## 2014-11-07 NOTE — Patient Instructions (Signed)
Start Zithromax (for bronchitis), you may take tessalon as needed for cough. Call if symptoms worsen or if not improved in 3 days.

## 2014-11-29 ENCOUNTER — Encounter: Payer: Self-pay | Admitting: Family

## 2015-01-16 ENCOUNTER — Other Ambulatory Visit: Payer: Self-pay | Admitting: Medical

## 2015-01-16 ENCOUNTER — Other Ambulatory Visit: Payer: Self-pay | Admitting: Family

## 2015-01-17 ENCOUNTER — Telehealth: Payer: Self-pay | Admitting: *Deleted

## 2015-01-17 DIAGNOSIS — I10 Essential (primary) hypertension: Secondary | ICD-10-CM

## 2015-01-17 MED ORDER — LISINOPRIL 2.5 MG PO TABS: 2.5000 mg | ORAL_TABLET | Freq: Every day | ORAL | Status: DC

## 2015-01-17 NOTE — Telephone Encounter (Signed)
Received fax from [facility] requesting lisinopril 2.5mg  refill. Sent #90 x no refills. Pt was last seen 10/2014 and has no future appointments scheduled. When do you want to see pt in the office again?

## 2015-01-19 NOTE — Telephone Encounter (Signed)
6 months please 

## 2015-01-20 NOTE — Telephone Encounter (Signed)
Left detailed message for patient to call our office and schedule 6 month follow up after 05/08/15

## 2015-01-20 NOTE — Telephone Encounter (Signed)
Please call pt scheduled her 6 month follow up on or after 05/08/15.  Thanks!

## 2015-02-13 ENCOUNTER — Other Ambulatory Visit: Payer: Self-pay | Admitting: Family Medicine

## 2015-02-18 NOTE — Telephone Encounter (Signed)
30 day supply of lexapro sent to pharmacy. Pt last seen 10/2014. When should pt follow up again?

## 2015-02-18 NOTE — Telephone Encounter (Signed)
Pt is due for follow up so we can see how she is doing on the lexapro.

## 2015-02-19 NOTE — Telephone Encounter (Signed)
Left detailed message informing patient of med refill and to call and schedule follow up.

## 2015-03-20 ENCOUNTER — Telehealth: Payer: Self-pay | Admitting: *Deleted

## 2015-03-20 MED ORDER — ESCITALOPRAM OXALATE 10 MG PO TABS: 10.0000 mg | ORAL_TABLET | Freq: Every day | ORAL | Status: DC

## 2015-03-20 NOTE — Telephone Encounter (Signed)
REceived fax from [facility] requesting refill of lexapro. Pt last seen 10/2014. Detailed message was left for pt 02/18/15 that office visit was due for further refills. 2 week supply sent to pharmacy. Please call pt to arrange appointment before further refills are needed. Thanks!

## 2015-03-21 NOTE — Telephone Encounter (Signed)
Left detailed message informing patient of 14 day refill and that she needs to be seen before further refills can be given.

## 2015-03-24 NOTE — Telephone Encounter (Signed)
Letter mailed to pt.  

## 2015-04-07 ENCOUNTER — Encounter: Payer: Self-pay | Admitting: Family

## 2015-04-07 ENCOUNTER — Ambulatory Visit (INDEPENDENT_AMBULATORY_CARE_PROVIDER_SITE_OTHER): Payer: Medicare Other | Admitting: Family

## 2015-04-07 VITALS — BP 120/76 | HR 75 | Temp 98.4°F | Resp 16 | Ht 65.75 in | Wt 173.8 lb

## 2015-04-07 DIAGNOSIS — E039 Hypothyroidism, unspecified: Secondary | ICD-10-CM

## 2015-04-07 DIAGNOSIS — I10 Essential (primary) hypertension: Secondary | ICD-10-CM | POA: Diagnosis not present

## 2015-04-07 DIAGNOSIS — F341 Dysthymic disorder: Secondary | ICD-10-CM | POA: Diagnosis not present

## 2015-04-07 MED ORDER — ESCITALOPRAM OXALATE 10 MG PO TABS: 10.0000 mg | ORAL_TABLET | Freq: Every day | ORAL | Status: DC

## 2015-04-07 MED ORDER — HYDROCHLOROTHIAZIDE 12.5 MG PO CAPS: 12.5000 mg | ORAL_CAPSULE | Freq: Every day | ORAL | Status: DC

## 2015-04-07 MED ORDER — LISINOPRIL 2.5 MG PO TABS: 2.5000 mg | ORAL_TABLET | Freq: Every day | ORAL | Status: DC

## 2015-04-07 NOTE — Progress Notes (Signed)
Pre visit review using our clinic review tool, if applicable. No additional management support is needed unless otherwise documented below in the visit note. 

## 2015-04-07 NOTE — Patient Instructions (Signed)
Please schedule a lab visit at the front desk. Follow up in 6 months.

## 2015-04-07 NOTE — Progress Notes (Signed)
Subjective:    Patient ID: Mallory Hardy, female    DOB: 01-05-1935, 80 y.o.   MRN: [id number]  HPI  Mallory Hardy is a 80 yr old female who presents today for follow up.  HTN-   BP Readings from Last 3 Encounters:  04/07/15 120/76  11/07/14 148/72  09/09/14 138/68  Maintained on hctz, lisinopril.  Hypothyroid- maintained on synthroid.  Lab Results  Component Value Date   TSH 0.84 08/07/2014   Depression/anxiety-  Maintained on lexapro 10mg . She reports that she continues to have family stress. Her adult son is living in her home and she is staying with her boyfriend who has dementia and is a resident at [facility].  Wt Readings from Last 3 Encounters:  04/07/15 173 lb 12.8 oz (78.835 kg)  11/07/14 166 lb 6.4 oz (75.479 kg)  09/09/14 167 lb 9.6 oz (76.023 kg)     Review of Systems See HPI  Past Medical History  Diagnosis Date  . Urinary tract infection   . Thyroid disease     hypothyroidism  . Migraine headache   . Renal calculus   . Allergy     seasonal  . Anxiety   . Depression   . GERD (gastroesophageal reflux disease)   . Constipation, chronic   . Diverticulosis   . Hypertension   . Skin cancer     squamous cell    History   Social History  . Marital Status: Widowed    Spouse Name: N/A  . Number of Children: 2  . Years of Education: N/A   Occupational History  . Retired    Social History Main Topics  . Smoking status: Never Smoker   . Smokeless tobacco: Never Used  . Alcohol Use: No  . Drug Use: No  . Sexual Activity: Not on file   Other Topics Concern  . Not on file   Social History Narrative    Past Surgical History  Procedure Laterality Date  . Thyroidectomy  1959    goiter  . Abdominal exploration surgery  1959  . Rotator cuff repair  2007  . Bilateral oophorectomy  2003  . Abdominal hysterectomy  1978  . Cataract extraction Bilateral     Family History  Problem Relation Age of Onset  . Cancer Neg Hx   .  Diabetes Neg Hx   . Heart attack Neg Hx   . Hyperlipidemia Neg Hx   . Sudden death Neg Hx   . Hypertension Mother   . Atrial fibrillation Sister     No Known Allergies  Current Outpatient Prescriptions on File Prior to Visit  Medication Sig Dispense Refill  . aspirin 81 MG EC tablet Take 1 tablet 3 x weekly.    Marland Kitchen b complex vitamins tablet Take 1 tablet by mouth daily.      . Biotin 5000 MCG CAPS Take 1 capsule by mouth daily.    . Calcium Carbonate-Vitamin D (CALTRATE 600+D) 600-400 MG-UNIT per tablet Take 1 tablet by mouth daily.    . cholecalciferol (VITAMIN D) 1000 UNITS tablet Take 6,000 Units by mouth daily.     . diclofenac sodium (VOLTAREN) 1 % GEL Apply topically. Apply as needed to the neck.    Marland Kitchen levothyroxine (SYNTHROID, LEVOTHROID) 50 MCG tablet Take 1 tablet (50 mcg total) by mouth daily. 90 tablet 1  . Multiple Vitamins-Minerals (MULTIVITAMIN WITH MINERALS) tablet Take 1 tablet by mouth daily.      . naproxen sodium (ANAPROX) 220  MG tablet Take 220 mg by mouth as needed. Aleve.     No current facility-administered medications on file prior to visit.    BP 120/76 mmHg  Pulse 75  Temp(Src) 98.4 F (36.9 C) (Oral)  Resp 16  Ht 5' 5.75" (1.67 m)  Wt 173 lb 12.8 oz (78.835 kg)  BMI 28.27 kg/m2  SpO2 95%       Objective:   Physical Exam  Constitutional: She is oriented to person, place, and time. She appears well-developed and well-nourished.  HENT:  Head: Normocephalic and atraumatic.  Cardiovascular: Normal rate, regular rhythm and normal heart sounds.   No murmur heard. Pulmonary/Chest: Effort normal and breath sounds normal. No respiratory distress. She has no wheezes.  Musculoskeletal: She exhibits no edema.  Neurological: She is alert and oriented to person, place, and time.  Skin: Skin is warm and dry.  Psychiatric: She has a normal mood and affect. Her behavior is normal. Judgment and thought content normal.          Assessment & Plan:

## 2015-04-08 NOTE — Assessment & Plan Note (Signed)
Fair control, continues to have situational stress. Continue lexapro 10mg .

## 2015-04-08 NOTE — Assessment & Plan Note (Signed)
Continue synthroid, obtain tsh.

## 2015-04-08 NOTE — Assessment & Plan Note (Addendum)
BP stable on current meds, continue same.  Obtain bmet.  

## 2015-04-14 DIAGNOSIS — H524 Presbyopia: Secondary | ICD-10-CM | POA: Diagnosis not present

## 2015-04-17 ENCOUNTER — Ambulatory Visit: Payer: Medicare Other | Admitting: Family

## 2015-04-18 ENCOUNTER — Other Ambulatory Visit: Payer: Medicare Other

## 2015-04-18 LAB — BASIC METABOLIC PANEL
BUN: 25 mg/dL — ABNORMAL HIGH (ref 6–23)
CALCIUM: 10.1 mg/dL (ref 8.4–10.5)
CO2: 29 mEq/L (ref 19–32)
Chloride: 102 mEq/L (ref 96–112)
Creatinine, Ser: 0.98 mg/dL (ref 0.40–1.20)
GFR: 58.27 mL/min — ABNORMAL LOW (ref >60.00)
Glucose, Bld: 107 mg/dL — ABNORMAL HIGH (ref 70–99)
Potassium: 3.6 mEq/L (ref 3.5–5.1)
SODIUM: 141 meq/L (ref 135–145)

## 2015-04-18 LAB — TSH: TSH: 0.65 u[IU]/mL (ref 0.35–4.50)

## 2015-04-22 ENCOUNTER — Encounter: Payer: Self-pay | Admitting: Family

## 2015-05-07 ENCOUNTER — Ambulatory Visit (INDEPENDENT_AMBULATORY_CARE_PROVIDER_SITE_OTHER): Payer: Medicare Other | Admitting: Internal Medicine

## 2015-05-07 ENCOUNTER — Encounter: Payer: Self-pay | Admitting: Internal Medicine

## 2015-05-07 VITALS — BP 122/64 | HR 69 | Temp 97.7°F | Ht 65.75 in | Wt 167.1 lb

## 2015-05-07 DIAGNOSIS — R197 Diarrhea, unspecified: Secondary | ICD-10-CM

## 2015-05-07 NOTE — Progress Notes (Signed)
Pre visit review using our clinic review tool, if applicable. No additional management support is needed unless otherwise documented below in the visit note. 

## 2015-05-07 NOTE — Progress Notes (Signed)
Subjective:    Patient ID: Mallory Hardy, female    DOB: [birth year], 80 y.o.   MRN: [id number]  DOS:  05/07/2015 Type of visit - description : Acute visit Interval history: Symptoms started 1-2 weeks ago: On and off diarrhea, not every day, described as very loose  stools, several episodes a day eventually the stools become watery. Has no warning, had some "accidents" few times (couldn't reach the commode on time). Has taken Imodium with some relief. No recent ABX  intake, but she has visited a friend at a nursing home everyday for the last few months   Review of Systems No fever chills, appetite remains good. No nausea, vomiting, blood or mucus in the stools. No abdominal pain. + GERD symptoms but they are at baseline. No dysuria, gross hematuria difficulty urinating  Past Medical History  Diagnosis Date  . Urinary tract infection   . Thyroid disease     hypothyroidism  . Migraine headache   . Renal calculus   . Allergy     seasonal  . Anxiety   . Depression   . GERD (gastroesophageal reflux disease)   . Constipation, chronic   . Diverticulosis   . Hypertension   . Skin cancer     squamous cell    Past Surgical History  Procedure Laterality Date  . Thyroidectomy  1959    goiter  . Abdominal exploration surgery  1959  . Rotator cuff repair  2007  . Bilateral oophorectomy  2003  . Abdominal hysterectomy  1978  . Cataract extraction Bilateral     History   Social History  . Marital Status: Widowed    Spouse Name: N/A  . Number of Children: 2  . Years of Education: N/A   Occupational History  . Retired    Social History Main Topics  . Smoking status: Never Smoker   . Smokeless tobacco: Never Used  . Alcohol Use: No  . Drug Use: No  . Sexual Activity: Not on file   Other Topics Concern  . Not on file   Social History Narrative        Medication List       This list is accurate as of: 05/07/15  5:40 PM.  Always use your most recent med list.                aspirin 81 MG EC tablet  Take 1 tablet 3 x weekly.     b complex vitamins tablet  Take 1 tablet by mouth daily.     Biotin 5000 MCG Caps  Take 1 capsule by mouth daily.     Calcium Carbonate-Vitamin D 600-400 MG-UNIT per tablet  Commonly known as:  CALTRATE 600+D  Take 1 tablet by mouth daily.     cholecalciferol 1000 UNITS tablet  Commonly known as:  VITAMIN D  Take 6,000 Units by mouth daily.     escitalopram 10 MG tablet  Commonly known as:  LEXAPRO  Take 1 tablet (10 mg total) by mouth daily.     hydrochlorothiazide 12.5 MG capsule  Commonly known as:  MICROZIDE  Take 1 capsule (12.5 mg total) by mouth daily.     levothyroxine 50 MCG tablet  Commonly known as:  SYNTHROID, LEVOTHROID  Take 1 tablet (50 mcg total) by mouth daily.     lisinopril 2.5 MG tablet  Commonly known as:  PRINIVIL,ZESTRIL  Take 1 tablet (2.5 mg total) by mouth daily.     multivitamin with  minerals tablet  Take 1 tablet by mouth daily.     naproxen sodium 220 MG tablet  Commonly known as:  ANAPROX  Take 220 mg by mouth as needed. Aleve.     VOLTAREN 1 % Gel  Generic drug:  diclofenac sodium  Apply topically. Apply as needed to the neck.           Objective:   Physical Exam BP 122/64 mmHg  Pulse 69  Temp(Src) 97.7 F (36.5 C) (Oral)  Ht 5' 5.75" (1.67 m)  Wt 167 lb 2 oz (75.807 kg)  BMI 27.18 kg/m2  SpO2 98% General:   Well developed, well nourished . NAD.  HEENT:  Normocephalic . Face symmetric, atraumatic Lungs:  CTA B Normal respiratory effort, no intercostal retractions, no accessory muscle use. Heart: RRR,  no murmur.  no pretibial edema bilaterally  Abdomen:  Not distended, soft, non-tender. No rebound or rigidity. Bowel sounds are slightly increased. Skin: Not pale. Not jaundice Neurologic:  alert & oriented X3.  Speech normal, gait appropriate for age and unassisted Psych--  Cognition and judgment appear intact.  Cooperative with normal  attention span and concentration.  Behavior appropriate. No anxious or depressed appearing.    Assessment & Plan:   Diarrhea, Acute diarrhea, not toxic appearing. Chart reviewed, not taking any new medication, no recent ABX. She has been at a local nursing home frequently. C. difficile? Plan: Fluids, bland diet, stool studies Imodium, probiotics. See instructions Call if not improving.

## 2015-05-07 NOTE — Patient Instructions (Signed)
Lab: Provided the patient  With containers for stools samples and enter orders: C. difficile, WBCs, culture DDX diarrhea -------- Rest drink plenty of water , fluids and follow a bland diet Start FLORASTOR  one tablet daily (a over-the-counter probiotic) Imodium 2 mg up to 4 or 5 times a day as needed for diarrhea  Call if not improving in the next 10 days Call anytime if you have fever, chills, blood in the stools, increased symptoms, nausea or vomiting.

## 2015-05-09 DIAGNOSIS — R197 Diarrhea, unspecified: Secondary | ICD-10-CM | POA: Diagnosis not present

## 2015-05-09 NOTE — Addendum Note (Signed)
Addended by: Modena Morrow D on: 05/09/2015 04:10 PM   Modules accepted: Orders

## 2015-05-10 LAB — FECAL LACTOFERRIN, QUANT: Lactoferrin: POSITIVE

## 2015-05-10 LAB — C. DIFFICILE GDH AND TOXIN A/B
C. DIFF TOXIN A/B: NOT DETECTED
C. difficile GDH: NOT DETECTED

## 2015-05-13 LAB — STOOL CULTURE

## 2015-05-14 ENCOUNTER — Telehealth: Payer: Self-pay | Admitting: *Deleted

## 2015-05-14 NOTE — Telephone Encounter (Signed)
Notes Recorded by Brunetta Jeans, PA-C on 05/13/2015 at 4:32 PM On review of prior results, lactoferrin is positive but culture negative. If symptoms resolving nothing further needed, but if persisting will need GI assessment to r/o IBD.

## 2015-05-14 NOTE — Telephone Encounter (Signed)
Called and spoke with the pt and informed her of recent culture results and note.  Pt verbalized understanding.  Pt stated that it has not been any change in the diarrhea.  It comes and goes and she has been using Imodium which helps.  She has not had any diarrhea since Monday night.  She stated that Dr. Larose Kells informed her that it may be a virus.  Pt said she would like to eat different foods and to wait a week and she how she does before the next step.  Informed the pt to let us know how she's doing.  Pt agreed.//AB/CMA

## 2015-05-19 ENCOUNTER — Telehealth: Payer: Self-pay | Admitting: Family

## 2015-05-19 DIAGNOSIS — R197 Diarrhea, unspecified: Secondary | ICD-10-CM

## 2015-05-19 NOTE — Telephone Encounter (Signed)
Feels she needs a gi referral

## 2015-05-19 NOTE — Telephone Encounter (Signed)
Recently seen by me with diarrhea, symptoms going on for approximately 3-4 weeks now.  C. difficile testing negative. Refer to GI --- DX diarrhea

## 2015-05-19 NOTE — Telephone Encounter (Signed)
Referral placed to GI 

## 2015-05-19 NOTE — Telephone Encounter (Signed)
I believe this is your patient

## 2015-05-27 ENCOUNTER — Encounter: Payer: Self-pay | Admitting: *Deleted

## 2015-05-30 ENCOUNTER — Encounter: Payer: Self-pay | Admitting: Gastroenterology

## 2015-05-30 ENCOUNTER — Ambulatory Visit (INDEPENDENT_AMBULATORY_CARE_PROVIDER_SITE_OTHER): Payer: Medicare Other | Admitting: Gastroenterology

## 2015-05-30 VITALS — BP 118/66 | HR 67 | Ht 66.0 in | Wt 162.8 lb

## 2015-05-30 DIAGNOSIS — R197 Diarrhea, unspecified: Secondary | ICD-10-CM

## 2015-05-30 MED ORDER — METRONIDAZOLE 500 MG PO TABS: 500.0000 mg | ORAL_TABLET | Freq: Two times a day (BID) | ORAL | Status: DC

## 2015-05-30 NOTE — Progress Notes (Signed)
Reviewed and agree with management.  If not better may consider a limited sigmoidoscopy for diagnosis Mallory Hardy. [name], M.D., [facility]

## 2015-05-30 NOTE — Progress Notes (Signed)
     05/30/2015 Mallory Hardy [id number] October 17, 1935   History of Present Illness:  This is a pleasant 80 year old female who is known to Dr. [name] for colonoscopy.  Last colonoscopy in 10/2010 showed only internal hemorrhoids.  Colonoscopy 08/2006 showed only mild diverticulosis.    She presents to our office today with complaints of diarrhea that began suddenly 5-6 weeks ago.  Began in the middle of the night and has been persistent since then.  Has diarrhea at least every other day, occurs several times per day with a lot of urgency and episodes of incontinence.  Stools described as sometimes watery and other times very loose.  She does take Imodium, which seems to help temporarily.  A couple of days a week she will not have any bowel movements at all.  Denies blood in stool.  Abdomen felt sore initially but has no pain recently.  Had some nausea in the beginning but none now.  No fevers.  TSH and BMP normal.  Stool for Cdiff negative as well as stool culture.  Stool WBC/lactoferrin was positive.  She is taking a probiotic daily.  No other new medications.  No recent travel, antibiotics, or bad food exposure.    Current Medications, Allergies, Past Medical History, Past Surgical History, Family History and Social History were reviewed in [employer] record.   Physical Exam: BP 118/66 mmHg  Pulse 67  Ht 5\' 6"  (1.676 m)  Wt 162 lb 12.8 oz (73.846 kg)  BMI 26.29 kg/m2 General: Well developed white female in no acute distress Head: Normocephalic and atraumatic Eyes:  Sclerae anicteric, conjunctiva pink  Ears: Normal auditory acuity Lungs: Clear throughout to auscultation Heart: Regular rate and rhythm Abdomen: Soft, non-distended.  Normal bowel sounds.  Mild diffuse TTP without R/R/G. Musculoskeletal: Symmetrical with no gross deformities  Extremities: No edema  Neurological: Alert oriented x 4, grossly non-focal Psychological:  Alert and cooperative. Normal mood  and affect  Assessment and Recommendations: -Diarrhea, acute to subacute:  Sudden in onset.  Certainly sounds infectious.  Stool for Cdiff and stool culture negative.  Stool WBC positive.  Will treat empirically with Flagyl 500 mg BID for 10 days.  Continue probiotic for now as well.  She will call back in 2 weeks with an update on her diarrhea, however, if it continues then may need to consider colonoscopy for further evaluation.

## 2015-05-30 NOTE — Patient Instructions (Addendum)
We have sent the following medications to your pharmacy for you to pick up at your convenience: Flagyl 500mg   Please call our office back in 2 week with an update on your condition. Please ask for my nurse Rollene Fare.

## 2015-06-09 ENCOUNTER — Telehealth: Payer: Self-pay | Admitting: Gastroenterology

## 2015-06-09 NOTE — Telephone Encounter (Signed)
Spoke with patient and diarrhea is still hanging on. Completed Flagyl. Does not have diarrhea all the time. Had it 3 times yesterday and only one the day before. She is wondering if it is due to stress. She is staying with someone at nursing home 24 hours day and is stressed. Please, advise.

## 2015-06-09 NOTE — Telephone Encounter (Signed)
May very well be due to stress and irritable bowel associated with that.  Could consider flexible sigmoidoscopy to make sure there is nothing else going one if she is agreeable.  Otherwise it would be symptomatic management.  Thank you,  Jess

## 2015-06-09 NOTE — Telephone Encounter (Signed)
Left a message for patient to call back. 

## 2015-06-10 NOTE — Telephone Encounter (Signed)
Patient notified of recommendations. Scheduled flex sig on 07/03/15 at 4:00 PM and pre visit on 06/26/15 at 2:00 PM.

## 2015-06-16 ENCOUNTER — Other Ambulatory Visit: Payer: Self-pay | Admitting: Family

## 2015-06-26 ENCOUNTER — Ambulatory Visit (AMBULATORY_SURGERY_CENTER): Payer: Self-pay | Admitting: *Deleted

## 2015-06-26 VITALS — Ht 66.0 in | Wt 163.4 lb

## 2015-06-26 DIAGNOSIS — R197 Diarrhea, unspecified: Secondary | ICD-10-CM

## 2015-06-26 NOTE — Progress Notes (Signed)
No allergies to eggs or soy. No problems with anesthesia.  No oxygen use  No diet drug use  

## 2015-06-27 ENCOUNTER — Encounter: Payer: Self-pay | Admitting: Gastroenterology

## 2015-07-03 ENCOUNTER — Encounter: Payer: Self-pay | Admitting: Gastroenterology

## 2015-07-03 ENCOUNTER — Ambulatory Visit (AMBULATORY_SURGERY_CENTER): Payer: Medicare Other | Admitting: Gastroenterology

## 2015-07-03 VITALS — BP 118/71 | HR 59 | Temp 96.7°F | Resp 29 | Ht 66.0 in | Wt 163.0 lb

## 2015-07-03 DIAGNOSIS — R197 Diarrhea, unspecified: Secondary | ICD-10-CM | POA: Diagnosis present

## 2015-07-03 MED ORDER — SODIUM CHLORIDE 0.9 % IV SOLN: 500.0000 mL | INTRAVENOUS | Status: DC

## 2015-07-03 NOTE — Progress Notes (Signed)
Report to PACU, RN, vss, BBS= Clear.  

## 2015-07-03 NOTE — Progress Notes (Signed)
Called to room to assist during endoscopic procedure.  Patient ID and intended procedure confirmed with present staff. Received instructions for my participation in the procedure from the performing physician.  

## 2015-07-03 NOTE — Op Note (Signed)
[place]  [address] [place], [address]   FLEXIBLE SIGMOIDOSCOPY PROCEDURE REPORT  PATIENT: Mallory Hardy, Mallory Hardy  MR#: [id number] BIRTHDATE: 28-May-1935 , 34  yrs. old GENDER: female ENDOSCOPIST: Inda Castle, MD REFERRED BY: Debbrah Alar, FNP PROCEDURE DATE:  07/03/2015 PROCEDURE:   Sigmoidoscopy with biopsy ASA CLASS:   Class II INDICATIONS:chronic diarrhea. MEDICATIONS: Monitored anesthesia care and Propofol 100 mg IV  DESCRIPTION OF PROCEDURE:   After the risks benefits and alternatives of the procedure were thoroughly explained, informed consent was obtained.  Digital exam revealed no abnormalities of the rectum. The LB [id number]  endoscope was introduced through the anus  and advanced to the descending colon , The exam was Without limitations.    The quality of the prep was The overall prep quality was excellent. . Estimated blood loss is zero unless otherwise noted in this procedure report. The instrument was then slowly withdrawn as the mucosa was fully examined.         COLON FINDINGS: There are no mucosal abnormalities throughout the left colon.  Random biopsies were taken to rule out microscopic colitis.    Retroflexed views revealed no abnormalities.    The scope was then withdrawn from the patient and the procedure terminated.  COMPLICATIONS: There were no immediate complications.  ENDOSCOPIC IMPRESSION: There are no mucosal abnormalities throughout the left colon. Random biopsies were taken to rule out microscopic colitis  RECOMMENDATIONS: Await biopsy results  REPEAT EXAM:  eSigned:  Inda Castle, MD 07/03/2015 3:35 PM   CC:

## 2015-07-03 NOTE — Patient Instructions (Signed)
Discharge instructions given. Biopsies taken. Resume previous medications. YOU HAD AN ENDOSCOPIC PROCEDURE TODAY AT THE [facility]:   Refer to the procedure report that was given to you for any specific questions about what was found during the examination.  If the procedure report does not answer your questions, please call your gastroenterologist to clarify.  If you requested that your care partner not be given the details of your procedure findings, then the procedure report has been included in a sealed envelope for you to review at your convenience later.  YOU SHOULD EXPECT: Some feelings of bloating in the abdomen. Passage of more gas than usual.  Walking can help get rid of the air that was put into your GI tract during the procedure and reduce the bloating. If you had a lower endoscopy (such as a colonoscopy or flexible sigmoidoscopy) you may notice spotting of blood in your stool or on the toilet paper. If you underwent a bowel prep for your procedure, you may not have a normal bowel movement for a few days.  Please Note:  You might notice some irritation and congestion in your nose or some drainage.  This is from the oxygen used during your procedure.  There is no need for concern and it should clear up in a day or so.  SYMPTOMS TO REPORT IMMEDIATELY:   Following lower endoscopy (colonoscopy or flexible sigmoidoscopy):  Excessive amounts of blood in the stool  Significant tenderness or worsening of abdominal pains  Swelling of the abdomen that is new, acute  Fever of 100F or higher   For urgent or emergent issues, a gastroenterologist can be reached at any hour by calling [phone].   DIET: Your first meal following the procedure should be a small meal and then it is ok to progress to your normal diet. Heavy or fried foods are harder to digest and may make you feel nauseous or bloated.  Likewise, meals heavy in dairy and vegetables can increase bloating.  Drink  plenty of fluids but you should avoid alcoholic beverages for 24 hours.  ACTIVITY:  You should plan to take it easy for the rest of today and you should NOT DRIVE or use heavy machinery until tomorrow (because of the sedation medicines used during the test).    FOLLOW UP: Our staff will call the number listed on your records the next business day following your procedure to check on you and address any questions or concerns that you may have regarding the information given to you following your procedure. If we do not reach you, we will leave a message.  However, if you are feeling well and you are not experiencing any problems, there is no need to return our call.  We will assume that you have returned to your regular daily activities without incident.  If any biopsies were taken you will be contacted by phone or by letter within the next 1-3 weeks.  Please call us at [phone] if you have not heard about the biopsies in 3 weeks.    SIGNATURES/CONFIDENTIALITY: You and/or your care partner have signed paperwork which will be entered into your electronic medical record.  These signatures attest to the fact that that the information above on your After Visit Summary has been reviewed and is understood.  Full responsibility of the confidentiality of this discharge information lies with you and/or your care-partner. 

## 2015-07-04 ENCOUNTER — Telehealth: Payer: Self-pay | Admitting: *Deleted

## 2015-07-04 ENCOUNTER — Telehealth: Payer: Self-pay | Admitting: Gastroenterology

## 2015-07-04 ENCOUNTER — Encounter (HOSPITAL_COMMUNITY): Payer: Self-pay | Admitting: Emergency Medicine

## 2015-07-04 ENCOUNTER — Emergency Department (HOSPITAL_COMMUNITY)
Admission: EM | Admit: 2015-07-04 | Discharge: 2015-07-05 | Disposition: A | Discharge status: Home / Self Care | Payer: Medicare Other | Attending: Emergency Medicine | Admitting: Emergency Medicine

## 2015-07-04 DIAGNOSIS — Z79899 Other long term (current) drug therapy: Secondary | ICD-10-CM | POA: Diagnosis not present

## 2015-07-04 DIAGNOSIS — Z7982 Long term (current) use of aspirin: Secondary | ICD-10-CM | POA: Diagnosis not present

## 2015-07-04 DIAGNOSIS — Z87440 Personal history of urinary (tract) infections: Secondary | ICD-10-CM | POA: Diagnosis not present

## 2015-07-04 DIAGNOSIS — R319 Hematuria, unspecified: Secondary | ICD-10-CM | POA: Insufficient documentation

## 2015-07-04 DIAGNOSIS — R1032 Left lower quadrant pain: Secondary | ICD-10-CM | POA: Diagnosis not present

## 2015-07-04 DIAGNOSIS — Z85828 Personal history of other malignant neoplasm of skin: Secondary | ICD-10-CM | POA: Diagnosis not present

## 2015-07-04 DIAGNOSIS — I10 Essential (primary) hypertension: Secondary | ICD-10-CM | POA: Diagnosis not present

## 2015-07-04 DIAGNOSIS — F419 Anxiety disorder, unspecified: Secondary | ICD-10-CM | POA: Diagnosis not present

## 2015-07-04 DIAGNOSIS — K219 Gastro-esophageal reflux disease without esophagitis: Secondary | ICD-10-CM | POA: Diagnosis not present

## 2015-07-04 DIAGNOSIS — N200 Calculus of kidney: Secondary | ICD-10-CM | POA: Diagnosis not present

## 2015-07-04 DIAGNOSIS — Z87442 Personal history of urinary calculi: Secondary | ICD-10-CM | POA: Diagnosis not present

## 2015-07-04 DIAGNOSIS — F329 Major depressive disorder, single episode, unspecified: Secondary | ICD-10-CM | POA: Insufficient documentation

## 2015-07-04 DIAGNOSIS — E039 Hypothyroidism, unspecified: Secondary | ICD-10-CM | POA: Diagnosis not present

## 2015-07-04 LAB — URINALYSIS, ROUTINE W REFLEX MICROSCOPIC
BILIRUBIN URINE: NEGATIVE
GLUCOSE, UA: NEGATIVE mg/dL
KETONES UR: NEGATIVE mg/dL
Nitrite: NEGATIVE
PH: 6.5 (ref 5.0–8.0)
Protein, ur: 100 mg/dL — AB
SPECIFIC GRAVITY, URINE: 1.017 (ref 1.005–1.030)
Urobilinogen, UA: 1 mg/dL (ref 0.0–1.0)

## 2015-07-04 LAB — URINE MICROSCOPIC-ADD ON

## 2015-07-04 NOTE — Telephone Encounter (Signed)
Number identifier, left message, follow-up  

## 2015-07-04 NOTE — Telephone Encounter (Signed)
The patient called me this evening to ask questions about her recent flexible sigmoidoscopy. She reports her procedure was yesterday and went well, and today she has had 2 episodes of hematuria. The first was significant, the second the a small amount. She denies any blood in the stools. I reviewed her flex sig report in which a few biopsies were taken but appeared normal. The patient reports a history of renal stones. I told her I think it's unlikely the procedure has resulted in hematuria and may more likely be to another primary urologic issue. If it continues to occur I told her to go to the ER. I told her to call her PCM on Monday to discuss this if otherwise stable. She asked I tell Dr. [name]. She verbalized understanding.

## 2015-07-04 NOTE — ED Notes (Signed)
Pt states she had a flexible sigmoidoscopy yesterday with biopsies  Pt states today she has had lower abd pain and pain on the left side near her hip bone  Pt states she started having blood in her urine today at 5pm

## 2015-07-05 ENCOUNTER — Emergency Department (HOSPITAL_COMMUNITY): Payer: Medicare Other

## 2015-07-05 DIAGNOSIS — N200 Calculus of kidney: Secondary | ICD-10-CM | POA: Diagnosis not present

## 2015-07-05 LAB — CBC WITH DIFFERENTIAL/PLATELET
BASOS ABS: 0 10*3/uL (ref 0.0–0.1)
BASOS PCT: 0 %
EOS ABS: 0.2 10*3/uL (ref 0.0–0.7)
EOS PCT: 3 %
HCT: 36.7 % (ref 36.0–46.0)
Hemoglobin: 12.7 g/dL (ref 12.0–15.0)
Lymphocytes Relative: 36 %
Lymphs Abs: 2.8 10*3/uL (ref 0.7–4.0)
MCH: 31.8 pg (ref 26.0–34.0)
MCHC: 34.6 g/dL (ref 30.0–36.0)
MCV: 92 fL (ref 78.0–100.0)
Monocytes Absolute: 0.7 10*3/uL (ref 0.1–1.0)
Monocytes Relative: 8 %
Neutro Abs: 4.1 10*3/uL (ref 1.7–7.7)
Neutrophils Relative %: 53 %
PLATELETS: 227 10*3/uL (ref 150–400)
RBC: 3.99 MIL/uL (ref 3.87–5.11)
RDW: 12.4 % (ref 11.5–15.5)
WBC: 7.8 10*3/uL (ref 4.0–10.5)

## 2015-07-05 LAB — COMPREHENSIVE METABOLIC PANEL
ALBUMIN: 4.4 g/dL (ref 3.5–5.0)
ALT: 21 U/L (ref 14–54)
AST: 26 U/L (ref 15–41)
Alkaline Phosphatase: 72 U/L (ref 38–126)
Anion gap: 8 (ref 5–15)
BUN: 21 mg/dL — ABNORMAL HIGH (ref 6–20)
CHLORIDE: 104 mmol/L (ref 101–111)
CO2: 27 mmol/L (ref 22–32)
CREATININE: 0.8 mg/dL (ref 0.44–1.00)
Calcium: 9.1 mg/dL (ref 8.9–10.3)
GFR calc non Af Amer: >60 mL/min (ref >60)
Glucose, Bld: 103 mg/dL — ABNORMAL HIGH (ref 65–99)
Potassium: 3.4 mmol/L — ABNORMAL LOW (ref 3.5–5.1)
SODIUM: 139 mmol/L (ref 135–145)
Total Bilirubin: 0.5 mg/dL (ref 0.3–1.2)
Total Protein: 7.6 g/dL (ref 6.5–8.1)

## 2015-07-05 MED ORDER — IOHEXOL 300 MG/ML  SOLN
50.0000 mL | Freq: Once | INTRAMUSCULAR | Status: AC | PRN
  Administered 2015-07-05: 50 mL via ORAL

## 2015-07-05 MED ORDER — IOHEXOL 300 MG/ML  SOLN
100.0000 mL | Freq: Once | INTRAMUSCULAR | Status: AC | PRN
  Administered 2015-07-05: 100 mL via INTRAVENOUS

## 2015-07-05 MED ORDER — CEPHALEXIN 500 MG PO CAPS: 500.0000 mg | ORAL_CAPSULE | Freq: Four times a day (QID) | ORAL | Status: DC

## 2015-07-05 NOTE — Discharge Instructions (Signed)
Take keflex as directed until gone. Follow up with [facility]. Return to the ED with worsening or concerning symptoms.

## 2015-07-05 NOTE — ED Provider Notes (Signed)
CSN: [id number]     Arrival date & time 07/04/15  2104 History   First MD Initiated Contact with Patient 07/05/15 0132     Chief Complaint  Patient presents with  . Hematuria     (Consider location/radiation/quality/duration/timing/severity/associated sxs/prior Treatment) HPI Comments: Patient is a 80 year old female with a past medical history of kidney stones, diverticulosis, and hypertension who presents with hematuria that started prior to arrival. Patient reports this is present with every urination. There is no pain with urination. She reports some associated aching left lower abdominal pain without radiation. Patient is concerned because she had a flex sigmoidoscopy yesterday to have a biopsy taken and is concerned this is a complication from the procedure. No aggravating/alleviating factors. No blood in stool. No fever or other associated symptoms.   Patient is a 80 y.o. female presenting with hematuria. The history is provided by the patient. No language interpreter was used.  Hematuria This is a new problem. The current episode started today. The problem occurs constantly. The problem has been unchanged. Associated symptoms include abdominal pain. Nothing aggravates the symptoms. She has tried nothing for the symptoms. The treatment provided no relief.    Past Medical History  Diagnosis Date  . Urinary tract infection   . Thyroid disease     hypothyroidism  . Migraine headache   . Renal calculus   . Allergy     seasonal  . Anxiety   . Depression   . GERD (gastroesophageal reflux disease)   . Constipation, chronic   . Diverticulosis   . Hypertension   . Skin cancer     squamous cell  . Hiatal hernia   . Esophageal stricture    Past Surgical History  Procedure Laterality Date  . Thyroidectomy  1959    goiter  . Abdominal exploration surgery  1959  . Rotator cuff repair  2007  . Bilateral oophorectomy  2003  . Abdominal hysterectomy  1978  . Cataract extraction  Bilateral    Family History  Problem Relation Age of Onset  . Cancer Neg Hx   . Diabetes Neg Hx   . Heart attack Neg Hx   . Hyperlipidemia Neg Hx   . Sudden death Neg Hx   . Colon cancer Neg Hx   . Hypertension Mother   . Atrial fibrillation Sister    Social History  Substance Use Topics  . Smoking status: Never Smoker   . Smokeless tobacco: Never Used  . Alcohol Use: No   OB History    No data available     Review of Systems  Gastrointestinal: Positive for abdominal pain.  Genitourinary: Positive for hematuria.  All other systems reviewed and are negative.     Allergies  Review of patient's allergies indicates no known allergies.  Home Medications   Prior to Admission medications   Medication Sig Start Date End Date Taking? Authorizing Provider  aspirin 81 MG tablet Take 81 mg by mouth daily. Takes 3 times a week    Historical Provider, MD  b complex vitamins tablet Take 1 tablet by mouth daily.      Historical Provider, MD  Biotin 5000 MCG CAPS Take 1 capsule by mouth daily.    Historical Provider, MD  Calcium Carbonate-Vitamin D (CALTRATE 600+D) 600-400 MG-UNIT per tablet Take 1 tablet by mouth daily. 11/01/14   Debbrah Alar, NP  cholecalciferol (VITAMIN D) 1000 UNITS tablet Take 6,000 Units by mouth daily.     Historical Provider, MD  diclofenac sodium (VOLTAREN) 1 % GEL Apply topically. Apply as needed to the neck.    Historical Provider, MD  diphenhydrAMINE (BENADRYL ALLERGY) 25 mg capsule Take 25 mg by mouth every 6 (six) hours as needed.    Historical Provider, MD  escitalopram (LEXAPRO) 10 MG tablet Take 1 tablet (10 mg total) by mouth daily. 04/07/15   Debbrah Alar, NP  hydrochlorothiazide (MICROZIDE) 12.5 MG capsule Take 1 capsule (12.5 mg total) by mouth daily. 04/07/15   Debbrah Alar, NP  levothyroxine (SYNTHROID, LEVOTHROID) 50 MCG tablet TAKE ONE TABLET BY MOUTH ONCE DAILY 06/16/15   Debbrah Alar, NP  lisinopril (PRINIVIL,ZESTRIL) 2.5  MG tablet Take 1 tablet (2.5 mg total) by mouth daily. 04/07/15   Debbrah Alar, NP  Multiple Vitamins-Minerals (MULTIVITAMIN WITH MINERALS) tablet Take 2 tablets by mouth daily.     Historical Provider, MD  naproxen sodium (ANAPROX) 220 MG tablet Take 220 mg by mouth as needed. Aleve.    Historical Provider, MD   BP 136/85 mmHg  Pulse 64  Temp(Src) 98.2 F (36.8 C) (Oral)  Resp 19  SpO2 97% Physical Exam  Constitutional: She is oriented to person, place, and time. She appears well-developed and well-nourished. No distress.  HENT:  Head: Normocephalic and atraumatic.  Eyes: Conjunctivae and EOM are normal.  Neck: Normal range of motion.  Cardiovascular: Normal rate and regular rhythm.  Exam reveals no gallop and no friction rub.   No murmur heard. Pulmonary/Chest: Effort normal and breath sounds normal. She has no wheezes. She has no rales. She exhibits no tenderness.  Abdominal: Soft. She exhibits no distension. There is tenderness. There is no rebound.  Mild LLQ tenderness to palpation. No other focal tenderness or peritoneal signs.   Musculoskeletal: Normal range of motion.  Neurological: She is alert and oriented to person, place, and time. Coordination normal.  Speech is goal-oriented. Moves limbs without ataxia.   Skin: Skin is warm and dry.  Psychiatric: She has a normal mood and affect. Her behavior is normal.  Nursing note and vitals reviewed.   ED Course  Procedures (including critical care time) Labs Review Labs Reviewed  URINALYSIS, ROUTINE W REFLEX MICROSCOPIC (NOT AT [facility]) - Abnormal; Notable for the following:    Color, Urine RED (*)    APPearance TURBID (*)    Hgb urine dipstick LARGE (*)    Protein, ur 100 (*)    Leukocytes, UA SMALL (*)    All other components within normal limits  COMPREHENSIVE METABOLIC PANEL - Abnormal; Notable for the following:    Potassium 3.4 (*)    Glucose, Bld 103 (*)    BUN 21 (*)    All other components within normal limits   URINE CULTURE  URINE MICROSCOPIC-ADD ON  CBC WITH DIFFERENTIAL/PLATELET    Imaging Review Ct Abdomen Pelvis W Contrast  07/05/2015   CLINICAL DATA:  Left lower quadrant pain. Hematuria. Flexible sigmoidoscopy yesterday with biopsies.  EXAM: CT ABDOMEN AND PELVIS WITH CONTRAST  TECHNIQUE: Multidetector CT imaging of the abdomen and pelvis was performed using the standard protocol following bolus administration of intravenous contrast.  CONTRAST:  31mL OMNIPAQUE IOHEXOL 300 MG/ML SOLN, 156mL OMNIPAQUE IOHEXOL 300 MG/ML SOLN  COMPARISON:  CT 10/24/2013  FINDINGS: Lower chest:  The included lung bases are clear.  Liver: Decreased density consistent with steatosis. Small subcentimeter hypodensity in the left hepatic lobe, unchanged from prior.  Hepatobiliary: Gallbladder physiologically distended. No biliary dilatation. No calcified stone.  Pancreas: Normal.  Spleen: Peripherally calcified density  at the splenic hilum measuring 7 mm likely represents small splenic artery aneurysm, unchanged in size.  Adrenal glands: No nodule.  Kidneys: Symmetric renal enhancement. No hydronephrosis. There are nonobstructing stones in the lower and upper right kidney. Symmetric excretion on delayed phase imaging.  Stomach/Bowel: Small hiatal hernia. Stomach physiologically distended. There are no dilated or thickened small bowel loops. Small-moderate volume of stool throughout the colon without colonic wall thickening. Particularly, no wall thickening involving the sigmoid colon. The appendix is not confidently identified.  Vascular/Lymphatic: No retroperitoneal adenopathy. Abdominal aorta is normal in caliber. Mild atherosclerosis without aneurysm.  Reproductive: The uterus is surgically absent. There is no adnexal mass.  Bladder: Physiologically distended. No bladder wall thickening. No intravesicular air.  Other: No free air, free fluid, or intra-abdominal fluid collection. Small fat containing umbilical hernia.   Musculoskeletal: There are no acute or suspicious osseous abnormalities. Mild degenerative change in the spine.  IMPRESSION: 1. No acute abnormality in the abdomen/pelvis. 2. Nonobstructing right renal stones.  Hepatic steatosis.   Electronically Signed   By: Jeb Levering M.D.   On: 07/05/2015 04:52   I have personally reviewed and evaluated these images and lab results as part of my medical decision-making.   EKG Interpretation None      MDM   Final diagnoses:  Abdominal pain, left lower quadrant  Hematuria    2:25 AM Labs unremarkable for acute changes. Patient's urinalysis shows hemoglobin without signs of infection.   5:10 AM Patient's CT unremarkable for acute changes or cause of hematuria. Urine sent for culture. Patient possibly had a kidney stone that passed. Patient will be treated for UTI with keflex. Patient advised to follow up with [facility] for further evaluation.   [address] [place], PA-C 07/05/15 7893  Julianne Rice, MD 07/06/15 0430

## 2015-07-06 LAB — URINE CULTURE: Special Requests: NORMAL

## 2015-07-08 ENCOUNTER — Telehealth: Payer: Self-pay | Admitting: *Deleted

## 2015-07-08 NOTE — Telephone Encounter (Signed)
Attempted to reach pt to see how she is doing per Dr. Kelby Fam request.  No answer, no ID on machine so no message left.  Will try again

## 2015-07-15 ENCOUNTER — Telehealth: Payer: Self-pay | Admitting: Gastroenterology

## 2015-07-15 NOTE — Telephone Encounter (Signed)
Patient given results again.

## 2015-07-29 DIAGNOSIS — R310 Gross hematuria: Secondary | ICD-10-CM | POA: Diagnosis not present

## 2015-08-06 DIAGNOSIS — R310 Gross hematuria: Secondary | ICD-10-CM | POA: Diagnosis not present

## 2015-08-06 DIAGNOSIS — C4490 Unspecified malignant neoplasm of skin, unspecified: Secondary | ICD-10-CM | POA: Diagnosis not present

## 2015-08-06 DIAGNOSIS — N200 Calculus of kidney: Secondary | ICD-10-CM | POA: Diagnosis not present

## 2015-08-19 DIAGNOSIS — R310 Gross hematuria: Secondary | ICD-10-CM | POA: Diagnosis not present

## 2015-08-21 ENCOUNTER — Other Ambulatory Visit: Payer: Self-pay | Admitting: Urology

## 2015-08-26 ENCOUNTER — Encounter (HOSPITAL_BASED_OUTPATIENT_CLINIC_OR_DEPARTMENT_OTHER): Payer: Self-pay | Admitting: *Deleted

## 2015-08-29 ENCOUNTER — Encounter (HOSPITAL_BASED_OUTPATIENT_CLINIC_OR_DEPARTMENT_OTHER): Payer: Self-pay | Admitting: *Deleted

## 2015-08-29 NOTE — Progress Notes (Signed)
NPO AFTER MN.  ARRIVE AT 1030.  NEEDS CBC, BMET, AND EKG.  MAY TAKE TYLENOL AM DOS W/ SIPS OF WATER .

## 2015-09-02 ENCOUNTER — Other Ambulatory Visit: Payer: Self-pay

## 2015-09-02 ENCOUNTER — Ambulatory Visit (HOSPITAL_BASED_OUTPATIENT_CLINIC_OR_DEPARTMENT_OTHER)
Admission: RE | Admit: 2015-09-02 | Discharge: 2015-09-02 | Disposition: A | Discharge status: Home / Self Care | Payer: Medicare Other | Source: Ambulatory Visit | Attending: Urology | Admitting: Urology

## 2015-09-02 ENCOUNTER — Ambulatory Visit (HOSPITAL_BASED_OUTPATIENT_CLINIC_OR_DEPARTMENT_OTHER): Payer: Medicare Other | Admitting: Anesthesiology

## 2015-09-02 ENCOUNTER — Encounter (HOSPITAL_BASED_OUTPATIENT_CLINIC_OR_DEPARTMENT_OTHER): Payer: Self-pay | Admitting: *Deleted

## 2015-09-02 ENCOUNTER — Encounter (HOSPITAL_BASED_OUTPATIENT_CLINIC_OR_DEPARTMENT_OTHER)
Admission: RE | Disposition: A | Discharge status: Home / Self Care | Payer: Self-pay | Source: Ambulatory Visit | Attending: Urology

## 2015-09-02 DIAGNOSIS — E039 Hypothyroidism, unspecified: Secondary | ICD-10-CM | POA: Insufficient documentation

## 2015-09-02 DIAGNOSIS — Z7982 Long term (current) use of aspirin: Secondary | ICD-10-CM | POA: Insufficient documentation

## 2015-09-02 DIAGNOSIS — K219 Gastro-esophageal reflux disease without esophagitis: Secondary | ICD-10-CM | POA: Insufficient documentation

## 2015-09-02 DIAGNOSIS — N201 Calculus of ureter: Secondary | ICD-10-CM | POA: Diagnosis not present

## 2015-09-02 DIAGNOSIS — N329 Bladder disorder, unspecified: Secondary | ICD-10-CM | POA: Diagnosis not present

## 2015-09-02 DIAGNOSIS — Z841 Family history of disorders of kidney and ureter: Secondary | ICD-10-CM | POA: Insufficient documentation

## 2015-09-02 DIAGNOSIS — R310 Gross hematuria: Secondary | ICD-10-CM | POA: Diagnosis not present

## 2015-09-02 DIAGNOSIS — I10 Essential (primary) hypertension: Secondary | ICD-10-CM | POA: Insufficient documentation

## 2015-09-02 DIAGNOSIS — N200 Calculus of kidney: Secondary | ICD-10-CM | POA: Diagnosis not present

## 2015-09-02 DIAGNOSIS — M1990 Unspecified osteoarthritis, unspecified site: Secondary | ICD-10-CM | POA: Insufficient documentation

## 2015-09-02 DIAGNOSIS — Z87442 Personal history of urinary calculi: Secondary | ICD-10-CM | POA: Insufficient documentation

## 2015-09-02 DIAGNOSIS — N202 Calculus of kidney with calculus of ureter: Secondary | ICD-10-CM | POA: Diagnosis not present

## 2015-09-02 DIAGNOSIS — N3289 Other specified disorders of bladder: Secondary | ICD-10-CM | POA: Diagnosis not present

## 2015-09-02 DIAGNOSIS — F329 Major depressive disorder, single episode, unspecified: Secondary | ICD-10-CM | POA: Diagnosis not present

## 2015-09-02 DIAGNOSIS — Z79899 Other long term (current) drug therapy: Secondary | ICD-10-CM | POA: Insufficient documentation

## 2015-09-02 DIAGNOSIS — K449 Diaphragmatic hernia without obstruction or gangrene: Secondary | ICD-10-CM | POA: Insufficient documentation

## 2015-09-02 HISTORY — DX: Gross hematuria: R31.0

## 2015-09-02 HISTORY — DX: Urethral caruncle: N36.2

## 2015-09-02 HISTORY — DX: Other complications of anesthesia, initial encounter: T88.59XA

## 2015-09-02 HISTORY — DX: Gastro-esophageal reflux disease without esophagitis: K21.9

## 2015-09-02 HISTORY — PX: HOLMIUM LASER APPLICATION: SHX5852

## 2015-09-02 HISTORY — DX: Other seasonal allergic rhinitis: J30.2

## 2015-09-02 HISTORY — DX: Other specified postprocedural states: Z98.890

## 2015-09-02 HISTORY — DX: Stress incontinence (female) (male): N39.3

## 2015-09-02 HISTORY — DX: Unspecified osteoarthritis, unspecified site: M19.90

## 2015-09-02 HISTORY — PX: CYSTOSCOPY W/ URETERAL STENT PLACEMENT: SHX1429

## 2015-09-02 HISTORY — DX: Unspecified symptoms and signs involving the genitourinary system: R39.9

## 2015-09-02 HISTORY — DX: Presence of dental prosthetic device (complete) (partial): Z97.2

## 2015-09-02 HISTORY — DX: Personal history of urinary calculi: Z87.442

## 2015-09-02 HISTORY — DX: Adverse effect of unspecified anesthetic, initial encounter: T41.45XA

## 2015-09-02 HISTORY — PX: CYSTOSCOPY WITH BIOPSY: SHX5122

## 2015-09-02 HISTORY — DX: Calculus of ureter: N20.1

## 2015-09-02 HISTORY — DX: Personal history of malignant neoplasm, unspecified: Z85.9

## 2015-09-02 HISTORY — DX: Bladder disorder, unspecified: N32.9

## 2015-09-02 HISTORY — DX: Postprocedural hypothyroidism: E89.0

## 2015-09-02 HISTORY — DX: Other specified postprocedural states: R11.2

## 2015-09-02 HISTORY — DX: Calculus of kidney: N20.0

## 2015-09-02 LAB — BASIC METABOLIC PANEL
ANION GAP: 8 (ref 5–15)
BUN: 25 mg/dL — ABNORMAL HIGH (ref 6–20)
CHLORIDE: 106 mmol/L (ref 101–111)
CO2: 27 mmol/L (ref 22–32)
CREATININE: 0.76 mg/dL (ref 0.44–1.00)
Calcium: 9.4 mg/dL (ref 8.9–10.3)
GFR calc Af Amer: >60 mL/min (ref >60)
GFR calc non Af Amer: >60 mL/min (ref >60)
Glucose, Bld: 106 mg/dL — ABNORMAL HIGH (ref 65–99)
POTASSIUM: 3.6 mmol/L (ref 3.5–5.1)
SODIUM: 141 mmol/L (ref 135–145)

## 2015-09-02 LAB — CBC
HCT: 37.7 % (ref 36.0–46.0)
Hemoglobin: 12.7 g/dL (ref 12.0–15.0)
MCH: 31.3 pg (ref 26.0–34.0)
MCHC: 33.7 g/dL (ref 30.0–36.0)
MCV: 92.9 fL (ref 78.0–100.0)
PLATELETS: 215 10*3/uL (ref 150–400)
RBC: 4.06 MIL/uL (ref 3.87–5.11)
RDW: 13.2 % (ref 11.5–15.5)
WBC: 5.4 10*3/uL (ref 4.0–10.5)

## 2015-09-02 SURGERY — CYSTOSCOPY, WITH BIOPSY
Anesthesia: General | Site: Ureter | Laterality: Right

## 2015-09-02 MED ORDER — FENTANYL CITRATE (PF) 100 MCG/2ML IJ SOLN
INTRAMUSCULAR | Status: AC
  Filled 2015-09-02: qty 4

## 2015-09-02 MED ORDER — CEFAZOLIN SODIUM-DEXTROSE 2-3 GM-% IV SOLR
INTRAVENOUS | Status: AC
  Filled 2015-09-02: qty 50

## 2015-09-02 MED ORDER — ONDANSETRON HCL 4 MG/2ML IJ SOLN
INTRAMUSCULAR | Status: AC
  Filled 2015-09-02: qty 2

## 2015-09-02 MED ORDER — IOHEXOL 350 MG/ML SOLN
INTRAVENOUS | Status: DC | PRN
  Administered 2015-09-02: 20 mL via URETHRAL

## 2015-09-02 MED ORDER — SODIUM CHLORIDE 0.9 % IR SOLN
Status: DC | PRN
Start: 2015-09-02 — End: 2015-09-02
  Administered 2015-09-02: 3000 mL via INTRAVESICAL

## 2015-09-02 MED ORDER — HYDROCODONE-ACETAMINOPHEN 5-325 MG PO TABS: 1.0000 | ORAL_TABLET | Freq: Four times a day (QID) | ORAL | Status: DC | PRN

## 2015-09-02 MED ORDER — ONDANSETRON HCL 4 MG/2ML IJ SOLN
INTRAMUSCULAR | Status: DC | PRN
  Administered 2015-09-02: 4 mg via INTRAVENOUS

## 2015-09-02 MED ORDER — PROPOFOL 10 MG/ML IV BOLUS
INTRAVENOUS | Status: AC
  Filled 2015-09-02: qty 20

## 2015-09-02 MED ORDER — FENTANYL CITRATE (PF) 100 MCG/2ML IJ SOLN
INTRAMUSCULAR | Status: DC | PRN
  Administered 2015-09-02 (×2): 50 ug via INTRAVENOUS

## 2015-09-02 MED ORDER — LIDOCAINE HCL (CARDIAC) 20 MG/ML IV SOLN
INTRAVENOUS | Status: DC | PRN
Start: 2015-09-02 — End: 2015-09-02
  Administered 2015-09-02: 50 mg via INTRAVENOUS

## 2015-09-02 MED ORDER — DEXAMETHASONE SODIUM PHOSPHATE 4 MG/ML IJ SOLN
INTRAMUSCULAR | Status: DC | PRN
  Administered 2015-09-02: 5 mg via INTRAVENOUS

## 2015-09-02 MED ORDER — DEXAMETHASONE SODIUM PHOSPHATE 10 MG/ML IJ SOLN
INTRAMUSCULAR | Status: AC
  Filled 2015-09-02: qty 1

## 2015-09-02 MED ORDER — PROPOFOL 10 MG/ML IV BOLUS
INTRAVENOUS | Status: DC | PRN
  Administered 2015-09-02: 150 mg via INTRAVENOUS
  Administered 2015-09-02: 50 mg via INTRAVENOUS

## 2015-09-02 MED ORDER — DOCUSATE SODIUM 100 MG PO CAPS: 100.0000 mg | ORAL_CAPSULE | Freq: Two times a day (BID) | ORAL | Status: AC

## 2015-09-02 MED ORDER — STERILE WATER FOR IRRIGATION IR SOLN
Status: DC | PRN
  Administered 2015-09-02: 3000 mL via INTRAVESICAL

## 2015-09-02 MED ORDER — LACTATED RINGERS IV SOLN
INTRAVENOUS | Status: DC
  Administered 2015-09-02: 11:00:00 via INTRAVENOUS
  Filled 2015-09-02: qty 1000

## 2015-09-02 MED ORDER — CIPROFLOXACIN HCL 500 MG PO TABS: 500.0000 mg | ORAL_TABLET | Freq: Two times a day (BID) | ORAL | Status: DC

## 2015-09-02 MED ORDER — CEFAZOLIN SODIUM-DEXTROSE 2-3 GM-% IV SOLR
2.0000 g | INTRAVENOUS | Status: AC
Start: 2015-09-02 — End: 2015-09-02
  Administered 2015-09-02: 2 g via INTRAVENOUS
  Filled 2015-09-02: qty 50

## 2015-09-02 SURGICAL SUPPLY — 23 items
BAG URO CATCHER STRL LF (DRAPE) ×3 IMPLANT
BASKET ZERO TIP NITINOL 2.4FR (BASKET) ×3 IMPLANT
CATH URET 5FR 28IN OPEN ENDED (CATHETERS) ×3 IMPLANT
FIBER LASER FLEXIVA 365 (UROLOGICAL SUPPLIES) ×3 IMPLANT
FIBER LASER TRAC TIP (UROLOGICAL SUPPLIES) IMPLANT
GLOVE BIO SURGEON STRL SZ7.5 (GLOVE) ×3 IMPLANT
GLOVE BIOGEL PI IND STRL 7.5 (GLOVE) ×4 IMPLANT
GLOVE BIOGEL PI INDICATOR 7.5 (GLOVE) ×2
GOWN L4 LG 24 PK N/S (GOWN DISPOSABLE) IMPLANT
GOWN STRL REUS W/ TWL LRG LVL3 (GOWN DISPOSABLE) ×2 IMPLANT
GOWN STRL REUS W/ TWL XL LVL3 (GOWN DISPOSABLE) ×2 IMPLANT
GOWN STRL REUS W/TWL LRG LVL3 (GOWN DISPOSABLE) ×1
GOWN STRL REUS W/TWL XL LVL3 (GOWN DISPOSABLE) ×1
GUIDEWIRE STR DUAL SENSOR (WIRE) ×3 IMPLANT
IV NS IRRIG 3000ML ARTHROMATIC (IV SOLUTION) ×3 IMPLANT
KIT ROOM TURNOVER WOR (KITS) ×3 IMPLANT
MANIFOLD NEPTUNE II (INSTRUMENTS) ×3 IMPLANT
NS IRRIG 500ML POUR BTL (IV SOLUTION) IMPLANT
PACK CYSTOSCOPY (CUSTOM PROCEDURE TRAY) ×3 IMPLANT
SCRUB PCMX 4 OZ (MISCELLANEOUS) ×3 IMPLANT
STENT URET 6FRX26 CONTOUR (STENTS) ×3 IMPLANT
SYRINGE IRR TOOMEY STRL 70CC (SYRINGE) IMPLANT
TUBE CONNECTING 12X1/4 (SUCTIONS) ×3 IMPLANT

## 2015-09-02 NOTE — H&P (Signed)
Chief Complaint Gross Hematuria    Referring Provider: Dr. Vivien Rossetti   History of Present Illness    This is a 80 year old female presents for evaluation of gross painless hematuria. She noticed this approximately 1 month ago. She is passing blood imminently since that time. She has not had clots. She is nonsmoker. She is no other urinary complaints. She does have a long-standing history of nephrolithiasis. She was treated multiple times with lithotripsy. A CT scan with contrast was done in the ER. It showed nonobstucting stones. It was not a CT urogram.    Interval history:  The patient follows up after having a CT urogram. It showed her nonobstructive right renal stones have not moved into her right ureter. There is a 4 mm stone as well as a 6 mm stone in the right ureter. Her cystoscopy today revealed a very red erythematous area in the right side of her bladder is concerning for CIS.   Past Medical History Problems  1. History of arthritis (Z87.39) 2. History of depression (Z86.59) 3. History of esophageal reflux (Z87.19) 4. History of hypertension (Z86.79) 5. History of hypothyroidism (Z86.39) 6. History of nausea and vomiting [id number])  Surgical History Problems  1. History of Excision Of Urethral Caruncle 2. History of Hysterectomy 3. History of Renal Lithotripsy 4. History of Rotator Cuff Repair 5. History of Thyroid Surgery Total Thyroidectomy  Current Meds 1. Aleve TABS;  Therapy: (Recorded:11Oct2016) to Recorded 2. Aspirin 81 MG TABS;  Therapy: (Recorded:24Jul2008) to Recorded 3. Benadryl TABS;  Therapy: (Recorded:11Oct2016) to Recorded 4. Biotin CAPS;  Therapy: (Recorded:11Oct2016) to Recorded 5. Hydrochlorothiazide 12.5 MG Oral Tablet;  Therapy: (Recorded:10Nov2010) to Recorded 6. Levoxyl TABS;  Therapy: (Recorded:11Oct2016) to Recorded 7. Lexapro 10 MG Oral Tablet;  Therapy: (Recorded:11Oct2016) to Recorded 8. Lisinopril 2.5 MG Oral Tablet;  Therapy: (Recorded:11Oct2016) to Recorded 9. Metamucil CAPS;  Therapy: (Recorded:10Nov2010) to Recorded 10. Multi-Vitamin Oral Tablet;   Therapy: (Recorded:10Nov2010) to Recorded 11. Vitamin D3 TABS;   Therapy: (Recorded:11Oct2016) to Recorded  Allergies No Known Allergies  1. No Known Allergies  Family History Problems  1. Family history of Death In The Family Father : Mother   DECEAESD AT AGE 26 - [place]; [place] 2. Family history of Death In The Family Mother : Mother   DECEASED AT AGE 39- PNEUMONIA, CONGESTIVE HEART FAILURE 3. Family history of Family Health Status Children ___ Living Sons   2 4. Family history of heart failure (Z82.49) : Mother 5. Family history of Hematuria : Mother 76. Family history of Nephrolithiasis : Mother  Social History Problems  1. Denied: History of Alcohol Use (History) 2. Caffeine Use   2- [employer] 3. Never smoker 4. Occupation:   RETIRED 5. Denied: History of Tobacco Use 6. Two children 7. Widowed  Vitals Vital Signs [Data Includes: Last 1 Day]  Recorded: [id number] 01:37PM  Blood Pressure: 147 / 75 Temperature: 98 F Heart Rate: 64  Results/Data Urine [Data Includes: Last 1 Day]   [id number]  COLOR YELLOW   APPEARANCE CLEAR   SPECIFIC GRAVITY 1.025   pH 6.0   GLUCOSE NEGATIVE   BILIRUBIN NEGATIVE   KETONE NEGATIVE   BLOOD 1+   PROTEIN NEGATIVE   NITRITE NEGATIVE   LEUKOCYTE ESTERASE TRACE   SQUAMOUS EPITHELIAL/HPF 0-5 HPF  WBC 0-5 WBC/HPF  RBC 10-20 RBC/HPF  BACTERIA NONE SEEN HPF  CRYSTALS NONE SEEN HPF  CASTS NONE SEEN LPF  Yeast NONE SEEN HPF   Procedure  Procedure: Cystoscopy   Indication:  Hematuria.  Informed Consent: Risks, benefits, and potential adverse events were discussed and informed consent was obtained.  Anesthesia:. Local anesthesia was administered intraurethrally with 2% lidocaine jelly.  Antibiotic prophylaxis: Ciprofloxacin.  Procedure Note:  Urethral meatus:. No  abnormalities.  Bladder: Visulization was clear. The ureteral orifices were in the normal anatomic position bilaterally. Examination of the bladder demonstrated erythematous mucosa located on the right side, at the base of the bladder concerning for CIS.    Assessment Assessed  1. Gross hematuria (R31.0) 2. Calculus of ureter (N20.1) 3. Lesion of bladder (N32.9)  Plan Gross hematuria  1. URINE CULTURE; Status:Hold For - Specimen/Data Collection,Appointment; Requested  for:01Nov2016;  Health Maintenance  2. UA With REFLEX; [Do Not Release]; Status:Complete;   [id number] 01:25PM  1. Bladder lesion on cystoscopy  The patient is a bladder lesion on the right side of the bladder that was concerning for CIS. She will be set up for a cystoscopy bladder biopsy and fulguration of bleeders.     2. Right ureteral stone 2  At the time of bladder biopsy will do a retrograde pyelogram on the right side to see if the stones are still present. If at that time they are, we will perform a ureteroscopy laser lithotripsy and ureteral stent placement. The patient is aware that she will wake up with a ureteral stent. She is also aware that if we are unable to safely pass the right ureter scope that we will place a right ureteral stent and come back another time for laser lithotripsy. She is agreeable to this plan.    3. Gross hematuria  Workup reveals the aforementioned bladder lesion, right ureteral stones, and bilateral nonobstructive nephrolithiasis. Will treat as above. She may be a candidate for lithotripsy once the above are resolved particularly for the nonobstructive right renal stone.   Amendment CC: Dr. Vivien Rossetti   Signatures Electronically signed by : Baruch Gouty, M.D.; Aug 19 2015  2:26PM EST

## 2015-09-02 NOTE — Discharge Instructions (Signed)
[facility] [phone] Post Ureteroscopy With or Without Stent Instructions  Definitions:  Ureter: The duct that transports urine from the kidney to the bladder. Stent:   A plastic hollow tube that is placed into the ureter, from the kidney to the                 bladder to prevent the ureter from swelling shut.  GENERAL INSTRUCTIONS:  Despite the fact that no skin incisions were used, the area around the ureter and bladder is raw and irritated. The stent is a foreign body which will further irritate the bladder wall. This irritation is manifested by increased frequency of urination, both day and night, and by an increase in the urge to urinate. In some, the urge to urinate is present almost always. Sometimes the urge is strong enough that you may not be able to stop yourself from urinating. The only real cure is to remove the stent and then give time for the bladder wall to heal which can't be done until the danger of the ureter swelling shut has passed, which varies.  You may see some blood in your urine while the stent is in place and a few days afterwards. Do not be alarmed, even if the urine was clear for a while. Get off your feet and drink lots of fluids until clearing occurs. If you start to pass clots or don't improve, call us.  DIET: You may return to your normal diet immediately. Because of the raw surface of your bladder, alcohol, spicy foods, acid type foods and drinks with caffeine may cause irritation or frequency and should be used in moderation. To keep your urine flowing freely and to avoid constipation, drink plenty of fluids during the day ( 8-10 glasses ). Tip: Avoid cranberry juice because it is very acidic.  ACTIVITY: Your physical activity doesn't need to be restricted. However, if you are very active, you may see some blood in your urine. We suggest that you reduce your activity under these circumstances until the bleeding has stopped.  BOWELS: It is  important to keep your bowels regular during the postoperative period. Straining with bowel movements can cause bleeding. A bowel movement every other day is reasonable. Use a mild laxative if needed, such as Milk of Magnesia 2-3 tablespoons, or 2 Dulcolax tablets. Call if you continue to have problems. If you have been taking narcotics for pain, before, during or after your surgery, you may be constipated. Take a laxative if necessary.   MEDICATION: You should resume your pre-surgery medications unless told not to. In addition you will often be given an antibiotic to prevent infection. These should be taken as prescribed until the bottles are finished unless you are having an unusual reaction to one of the drugs.  PROBLEMS YOU SHOULD REPORT TO [place]:  Fevers over 100.5 Fahrenheit.  Heavy bleeding, or clots ( See above notes about blood in urine ).  Inability to urinate.  Drug reactions ( hives, rash, nausea, vomiting, diarrhea ).  Severe burning or pain with urination that is not improving.  FOLLOW-UP: You will need a follow-up appointment to monitor your progress. Call for this appointment at the number listed above. Usually the first appointment will be about three to fourteen days after your surgery.     CYSTOSCOPY HOME CARE INSTRUCTIONS  Activity: Rest for the remainder of the day.  Do not drive or operate equipment today.  You may resume normal activities in one to two days  as instructed by your physician.   Meals: Drink plenty of liquids and eat light foods such as gelatin or soup this evening.  You may return to a normal meal plan tomorrow.  Return to Work: You may return to work in one to two days or as instructed by your physician.  Special Instructions / Symptoms: Call your physician if any of these symptoms occur:   -persistent or heavy bleeding  -bleeding which continues after first few urination  -large blood clots that are difficult to pass  -urine stream  diminishes or stops completely  -fever equal to or higher than 101 degrees Farenheit.  -cloudy urine with a strong, foul odor  -severe pain  Females should always wipe from front to back after elimination.  You may feel some burning pain when you urinate.  This should disappear with time.  Applying moist heat to the lower abdomen or a hot tub bath may help relieve the pain. \  Follow-Up / Date of Return Visit to Your Physician:   Call for an appointment to arrange follow-up.  Patient Signature:  ________________________________________________________  Nurse's Signature:  ________________________________________________________  Post Anesthesia Home Care Instructions  Activity: Get plenty of rest for the remainder of the day. A responsible adult should stay with you for 24 hours following the procedure.  For the next 24 hours, DO NOT: -Drive a car -Paediatric nurse -Drink alcoholic beverages -Take any medication unless instructed by your physician -Make any legal decisions or sign important papers.  Meals: Start with liquid foods such as gelatin or soup. Progress to regular foods as tolerated. Avoid greasy, spicy, heavy foods. If nausea and/or vomiting occur, drink only clear liquids until the nausea and/or vomiting subsides. Call your physician if vomiting continues.  Special Instructions/Symptoms: Your throat may feel dry or sore from the anesthesia or the breathing tube placed in your throat during surgery. If this causes discomfort, gargle with warm salt water. The discomfort should disappear within 24 hours.  If you had a scopolamine patch placed behind your ear for the management of post- operative nausea and/or vomiting:  1. The medication in the patch is effective for 72 hours, after which it should be removed.  Wrap patch in a tissue and discard in the trash. Wash hands thoroughly with soap and water. 2. You may remove the patch earlier than 72 hours if you experience  unpleasant side effects which may include dry mouth, dizziness or visual disturbances. 3. Avoid touching the patch. Wash your hands with soap and water after contact with the patch.

## 2015-09-02 NOTE — Op Note (Addendum)
Preoperative diagnosis: Gross hematuria, bladder lesion, right ureteral stone 2, right renal stone  Postoperative diagnosis: Same  Procedure: Cystoscopy, bladder biopsy, fulguration of bleeders, right retrograde pyelogram with interpretation, right ureteroscopy, laser lithotripsy, stone basketing, right ureteral stent placement (6 French by 26 cm)  Surgeon: Dr. Baruch Gouty  Anesthesia: Gen.  Findings: Right ureteral stone 2, bladder lesion concerning for C I S in the posterior bladder wall  Radiological findings: On right retrograde pyelogram, there were 2 filling defects in the right ureter consistent with the known stone. There is also filling defect in the inferior pole of the right kidney consistent with a known foreign half millimeters stone. At the end the case, fluoroscopy showed correct stent placement.  Specimens: Bladder biopsy 3, right ureteral stone 2  Disposition: Stable to postanesthesia care unit  Indications for procedure: The patient is a 80 year old female who originally presented with gross hematuria. She underwent a hematuria workup. She was found to have a distal right ureteral 7 mm stone and a mid 4 mm right ureteral stone. She also was found to have a 4-1/2 mm nonobstructing right inferior pole stone. Cystoscopy revealed a bladder lesion in the posterior aspect of the bladder that was concerning for CIS.  Description of procedure: The patient in the preoperative area. All risks, benefits, and indications of procedure prescribed great detail. The patient consented to procedure. Preoperative antibiotic given. The patient was taken back to the operative theater. She was prepped and draped in the usual sterile fashion after placed in dorsal lithotomy. Timeout was called. A 21 French 30 scope was inserted patient bladder per urethra atraumatically. Pan cystoscopy was unremarkable except for a lesion in the posterior portion of the bladder was concerning for CIS. This was  biopsied 3 times. This was sent to pathology. The entire lesion which is approximately 3 cm in size fulgurated. A right retrograde pyelogram was then obtained. This showed a filling defect in the mid and distal ureter consistent with a known right ureteral stones. There is also filling defect in the inferior pole of the right kidney consistent with a 4 mm known stone. A  sensor wire was then placed the level renal pelvis and cystoscope withdrawn. semirigid ureteroscope was inserted in patient's right ureter orifice and the 7 mm stone was encountered in the distal ureter. It was broken into 3 small fragments with laser lithotripsy. These fragments were then removed from the right ureter individually and deposited within the bladder. Further examination of the ureter revealed a 4 mm stone in the mid ureter. This was grasped with a stone basket  atraumatically. Pendergrast was then continued to the right side. No further stones were seen however near the junction of the mid to proximal ureter, it was too difficult to continue navigating the scope due to the patient's anatomy and the fact this was a virgin ureter that had not process stent. Pain ureteroscopy was abandoned at this time. She did not have any others filling defects on her retrograde pyelogram or on her previous CT scan to suggest a another stone in her proximal ureter. A retrograde pyelogram was obtained through the ureteroscope for identification of the right renal pelvis for stent placement. There ureteroscope was then withdrawn. There was some mild trauma from the stones are in the ureter distally which is mainly notices erythema of the ureter. We decided at this time to avoid any further trauma to the right ureter to not attempt to manipulate the right inferior pole stone.  The  cystoscope was then reinserted over the sensor wire.  a 6 [place] by 2670 double-J ureteral stent was then placed over the sensor wire and the sensor wire removed. The correct  placement of the stent was confirmed to curl seen in the patient's right renal pelvis on fluoroscopy and a curl seen in the patient's urinary bladder with trabeculations. The patient bladder was then drained and stone fragments evacuated. The cystoscope and all stone fragment were removed and sent to pathology. The patient was then woken from anesthesia and transferred in stable condition to postanesthesia care unit.  Plan: The patient will follow-up in one to 2 weeks for stent removal and to discuss her pathology results from her bladder biopsy. She will also get an ultrasound 1 month after stent removal to ensure no saline hydronephrosis. In regards to her right inferior pole stone, we will get a renal ultrasound in 12 months to ensure it doesn't increase in size.  We will also get a urinalysis that time to ensure hematuria is resolved, assuming her bladder biopsy pathology is negative.   Addendum: She may benefit from a 24 hr urine collection secondary to her recurrent stones. Will discuss with patient at next visit.

## 2015-09-02 NOTE — Anesthesia Postprocedure Evaluation (Signed)
  Anesthesia Post-op Note  Patient: Mallory Hardy  Procedure(s) Performed: Procedure(s) (LRB): CYSTOSCOPY WITH BIOPSY (N/A) RIGHT URETEROSCOPY WITH RETROGRADE PYELOGRAM/ AND URETERAL STENT PLACEMENT (Right) LASER LITHOTRIPSY RIGHT (Right)  Patient Location: PACU  Anesthesia Type: General  Level of Consciousness: awake and alert   Airway and Oxygen Therapy: Patient Spontanous Breathing  Post-op Pain: mild  Post-op Assessment: Post-op Vital signs reviewed, Patient's Cardiovascular Status Stable, Respiratory Function Stable, Patent Airway and No signs of Nausea or vomiting  Last Vitals:  Filed Vitals:   09/02/15 1330  BP: 142/68  Pulse: 65  Temp:   Resp:     Post-op Vital Signs: stable   Complications: No apparent anesthesia complications

## 2015-09-02 NOTE — Transfer of Care (Signed)
Immediate Anesthesia Transfer of Care Note  Patient: Mallory Hardy  Procedure(s) Performed: Procedure(s): CYSTOSCOPY WITH BIOPSY (N/A) RIGHT URETEROSCOPY WITH RETROGRADE PYELOGRAM/ AND URETERAL STENT PLACEMENT (Right) LASER LITHOTRIPSY RIGHT (Right)  Patient Location: PACU  Anesthesia Type:General  Level of Consciousness: awake and oriented  Airway & Oxygen Therapy: Patient Spontanous Breathing and Patient connected to nasal cannula oxygen  Post-op Assessment: Report given to RN  Post vital signs: Reviewed and stable  Last Vitals:  Filed Vitals:   09/02/15 1203  BP: 135/61  Pulse: 66  Temp: 36.4 C  Resp: 15    Complications: No apparent anesthesia complications

## 2015-09-02 NOTE — Anesthesia Preprocedure Evaluation (Signed)
Anesthesia Evaluation  Patient identified by MRN, date of birth, ID band Patient awake    Reviewed: Allergy & Precautions, H&P , NPO status , Patient's Chart, lab work & pertinent test results  History of Anesthesia Complications (+) PONV and PROLONGED EMERGENCE  Airway Mallampati: II  TM Distance: >3 FB Neck ROM: full    Dental no notable dental hx. (+) Dental Advisory Given, Teeth Intact   Pulmonary neg pulmonary ROS,    Pulmonary exam normal breath sounds clear to auscultation       Cardiovascular Exercise Tolerance: Good hypertension, Pt. on medications Normal cardiovascular exam Rhythm:regular Rate:Normal     Neuro/Psych negative neurological ROS  negative psych ROS   GI/Hepatic negative GI ROS, Neg liver ROS, hiatal hernia, GERD  Controlled,  Endo/Other  negative endocrine ROSHypothyroidism   Renal/GU negative Renal ROS  negative genitourinary   Musculoskeletal   Abdominal   Peds  Hematology negative hematology ROS (+)   Anesthesia Other Findings   Reproductive/Obstetrics negative OB ROS                             Anesthesia Physical Anesthesia Plan  ASA: II  Anesthesia Plan: General   Post-op Pain Management:    Induction: Intravenous  Airway Management Planned: LMA  Additional Equipment:   Intra-op Plan:   Post-operative Plan:   Informed Consent: I have reviewed the patients History and Physical, chart, labs and discussed the procedure including the risks, benefits and alternatives for the proposed anesthesia with the patient or authorized representative who has indicated his/her understanding and acceptance.   Dental Advisory Given  Plan Discussed with: CRNA and Surgeon  Anesthesia Plan Comments:         Anesthesia Quick Evaluation

## 2015-09-02 NOTE — Anesthesia Procedure Notes (Signed)
Procedure Name: LMA Insertion Date/Time: 09/02/2015 12:32 PM Performed by: Bethena Roys T Pre-anesthesia Checklist: Patient identified, Emergency Drugs available, Suction available and Patient being monitored Patient Re-evaluated:Patient Re-evaluated prior to inductionOxygen Delivery Method: Circle System Utilized Preoxygenation: Pre-oxygenation with 100% oxygen Intubation Type: IV induction Ventilation: Mask ventilation without difficulty LMA: LMA inserted LMA Size: 4.0 Number of attempts: 1 Placement Confirmation: positive ETCO2 Dental Injury: Teeth and Oropharynx as per pre-operative assessment  Comments: Gauze roll between teeth

## 2015-09-03 ENCOUNTER — Encounter (HOSPITAL_BASED_OUTPATIENT_CLINIC_OR_DEPARTMENT_OTHER): Payer: Self-pay | Admitting: Urology

## 2015-09-08 DIAGNOSIS — R310 Gross hematuria: Secondary | ICD-10-CM | POA: Diagnosis not present

## 2015-09-08 DIAGNOSIS — N202 Calculus of kidney with calculus of ureter: Secondary | ICD-10-CM | POA: Diagnosis not present

## 2015-09-08 DIAGNOSIS — N201 Calculus of ureter: Secondary | ICD-10-CM | POA: Diagnosis not present

## 2015-09-24 ENCOUNTER — Other Ambulatory Visit: Payer: Self-pay | Admitting: Family

## 2015-10-01 ENCOUNTER — Encounter: Payer: Self-pay | Admitting: Family

## 2015-10-01 ENCOUNTER — Ambulatory Visit (INDEPENDENT_AMBULATORY_CARE_PROVIDER_SITE_OTHER): Payer: Medicare Other | Admitting: Family

## 2015-10-01 VITALS — BP 132/70 | HR 79 | Temp 98.3°F | Resp 16 | Ht 66.0 in | Wt 171.4 lb

## 2015-10-01 DIAGNOSIS — Z1239 Encounter for other screening for malignant neoplasm of breast: Secondary | ICD-10-CM

## 2015-10-01 DIAGNOSIS — Z1231 Encounter for screening mammogram for malignant neoplasm of breast: Secondary | ICD-10-CM | POA: Diagnosis not present

## 2015-10-01 DIAGNOSIS — R739 Hyperglycemia, unspecified: Secondary | ICD-10-CM

## 2015-10-01 DIAGNOSIS — I10 Essential (primary) hypertension: Secondary | ICD-10-CM

## 2015-10-01 DIAGNOSIS — E039 Hypothyroidism, unspecified: Secondary | ICD-10-CM

## 2015-10-01 DIAGNOSIS — F341 Dysthymic disorder: Secondary | ICD-10-CM

## 2015-10-01 LAB — TSH: TSH: 0.61 u[IU]/mL (ref 0.35–4.50)

## 2015-10-01 LAB — HEMOGLOBIN A1C: HEMOGLOBIN A1C: 5.9 % (ref 4.6–6.5)

## 2015-10-01 MED ORDER — LISINOPRIL 2.5 MG PO TABS: 2.5000 mg | ORAL_TABLET | Freq: Every morning | ORAL | Status: DC

## 2015-10-01 MED ORDER — HYDROCHLOROTHIAZIDE 12.5 MG PO CAPS: 12.5000 mg | ORAL_CAPSULE | Freq: Every morning | ORAL | Status: DC

## 2015-10-01 NOTE — Assessment & Plan Note (Signed)
Stable on current meds.  Continue same. 

## 2015-10-01 NOTE — Assessment & Plan Note (Signed)
Stable on synthroid.  Obtain follow up TSH. 

## 2015-10-01 NOTE — Progress Notes (Signed)
Subjective:    Patient ID: Mallory Hardy, female    DOB: 02/02/1935, 80 y.o.   MRN: [id number]  HPI  [name] is a 80 yr old female who presents today for 6 month follow up.    Depression/anxiety-patient is maintained on lexapro. Reports mood is good.  Anxiety stable.    Hypothyroid- maintained on synthroid.   Lab Results  Component Value Date   TSH 0.65 04/18/2015   Migraines- no recent migraines.    HTN- maintained on hctz, lisinopril.  Normal K+ last month on bmet.   BP Readings from Last 3 Encounters:  10/01/15 132/70  09/02/15 135/66  07/05/15 135/62    Review of Systems See HPI  Past Medical History  Diagnosis Date  . Migraine headache   . Anxiety   . Depression   . Diverticulosis   . Hypertension   . Hiatal hernia   . Hypothyroidism, postsurgical   . History of kidney stones   . History of squamous cell carcinoma excision   . Arthritis     SHOULDERS  . Seasonal allergic rhinitis   . Gross hematuria   . Urethral caruncle   . Right ureteral stone   . Lesion of bladder   . Nephrolithiasis     RIGHT SIDE  . History of esophageal dilatation     FOR STRICTURE  . Complication of anesthesia     SLOW TO WAKE  . PONV (postoperative nausea and vomiting)   . Lower urinary tract symptoms (LUTS)   . SUI (stress urinary incontinence, female)   . Wears partial dentures     lower  . Mild acid reflux     no meds//  but made changes with diet and eating times    Social History   Social History  . Marital Status: Widowed    Spouse Name: N/A  . Number of Children: 2  . Years of Education: N/A   Occupational History  . Retired    Social History Main Topics  . Smoking status: Never Smoker   . Smokeless tobacco: Never Used  . Alcohol Use: No  . Drug Use: No  . Sexual Activity: Not on file   Other Topics Concern  . Not on file   Social History Narrative    Past Surgical History  Procedure Laterality Date  . Thyroidectomy  1959    goiter  .  Abdominal exploration surgery  1959    r/o endometriosis  . Vaginal hysterectomy  1978  . Exploratory laparotomy w/ bilateral salpingoophorectomy  08-21-2002  . Shoulder arthroscopy with open rotator cuff repair and distal clavicle acrominectomy Right 06-14-2006  . Anterior and posterior repair  06-03-2003  ("bladder tack")    AND CYSTO/  EXCISION URETHRAL CARUNCLE   . Extracorporeal shock wave lithotripsy  x5  1990--2010  . Multiple vaginal lesion bx's  08-31-2001  . Cysto/  periuretheral excision and bx  11-25-2009  . Cataract extraction w/ intraocular lens  implant, bilateral    . Cystoscopy with biopsy N/A 09/02/2015    Procedure: CYSTOSCOPY WITH BIOPSY;  Surgeon: Nickie Retort, MD;  Location: [facility];  Service: Urology;  Laterality: N/A;  . Cystoscopy w/ ureteral stent placement Right 09/02/2015    Procedure: RIGHT URETEROSCOPY WITH RETROGRADE PYELOGRAM/ AND URETERAL STENT PLACEMENT;  Surgeon: Nickie Retort, MD;  Location: [facility];  Service: Urology;  Laterality: Right;  . Holmium laser application Right [id number]    Procedure: LASER LITHOTRIPSY RIGHT;  Surgeon: Nickie Retort, MD;  Location: [facility];  Service: Urology;  Laterality: Right;    Family History  Problem Relation Age of Onset  . Cancer Neg Hx   . Diabetes Neg Hx   . Heart attack Neg Hx   . Hyperlipidemia Neg Hx   . Sudden death Neg Hx   . Colon cancer Neg Hx   . Hypertension Mother   . Atrial fibrillation Sister     No Known Allergies  Current Outpatient Prescriptions on File Prior to Visit  Medication Sig Dispense Refill  . aspirin 81 MG tablet Take 81 mg by mouth 3 (three) times a week.     . Biotin 5000 MCG CAPS Take 2 capsules by mouth every evening.    . Calcium Carbonate-Vitamin D (CALTRATE 600+D) 600-400 MG-UNIT per tablet Take 1 tablet by mouth daily. (Patient taking differently: Take 1 tablet by mouth every morning. )    .  Cholecalciferol (VITAMIN D3) 5000 UNITS CAPS Take 2 capsules by mouth every evening.    . diclofenac sodium (VOLTAREN) 1 % GEL Apply 2 g topically 3 (three) times daily as needed (for neck/shoulder pain.).     Marland Kitchen diphenhydrAMINE (BENADRYL ALLERGY) 25 mg capsule Take 25 mg by mouth at bedtime.     . docusate sodium (COLACE) 100 MG capsule Take 1 capsule (100 mg total) by mouth 2 (two) times daily. (Patient taking differently: Take 100 mg by mouth daily. ) 10 capsule 0  . escitalopram (LEXAPRO) 10 MG tablet TAKE ONE TABLET BY MOUTH ONCE DAILY 90 tablet 1  . hydrochlorothiazide (MICROZIDE) 12.5 MG capsule Take 1 capsule (12.5 mg total) by mouth daily. (Patient taking differently: Take 12.5 mg by mouth every morning. ) 90 capsule 1  . levothyroxine (SYNTHROID, LEVOTHROID) 50 MCG tablet TAKE ONE TABLET BY MOUTH ONCE DAILY (Patient taking differently: TAKE ONE TABLET BY MOUTH ONCE DAILY--  takes in pm) 90 tablet 1  . lisinopril (PRINIVIL,ZESTRIL) 2.5 MG tablet Take 1 tablet (2.5 mg total) by mouth daily. (Patient taking differently: Take 2.5 mg by mouth every morning. ) 90 tablet 1  . Multiple Vitamins-Minerals (MULTIVITAMIN WITH MINERALS) tablet Take 1 tablet by mouth every morning.      No current facility-administered medications on file prior to visit.    BP 132/70 mmHg  Pulse 79  Temp(Src) 98.3 F (36.8 C) (Oral)  Resp 16  Ht 5\' 6"  (1.676 m)  Wt 171 lb 6.4 oz (77.747 kg)  BMI 27.68 kg/m2  SpO2 95%   Lab Results  Component Value Date   HGBA1C 5.7* 03/07/2013         Objective:   Physical Exam  Constitutional: She is oriented to person, place, and time. She appears well-developed and well-nourished.  HENT:  Head: Normocephalic and atraumatic.  Cardiovascular: Normal rate, regular rhythm and normal heart sounds.   No murmur heard. Pulmonary/Chest: Effort normal and breath sounds normal. No respiratory distress. She has no wheezes.  Musculoskeletal: She exhibits no edema.    Lymphadenopathy:    She has no cervical adenopathy.  Neurological: She is oriented to person, place, and time.  Psychiatric: She has a normal mood and affect. Her behavior is normal. Judgment and thought content normal.          Assessment & Plan:

## 2015-10-01 NOTE — Patient Instructions (Signed)
Please complete lab work prior to leaving.   

## 2015-10-01 NOTE — Assessment & Plan Note (Signed)
Stable on lexapro, continue same.  

## 2015-10-01 NOTE — Progress Notes (Signed)
Pre visit review using our clinic review tool, if applicable. No additional management support is needed unless otherwise documented below in the visit note. 

## 2015-10-02 ENCOUNTER — Encounter: Payer: Self-pay | Admitting: Family

## 2015-10-14 DIAGNOSIS — N201 Calculus of ureter: Secondary | ICD-10-CM | POA: Diagnosis not present

## 2015-10-27 ENCOUNTER — Ambulatory Visit (HOSPITAL_BASED_OUTPATIENT_CLINIC_OR_DEPARTMENT_OTHER): Payer: Medicare Other

## 2015-11-04 ENCOUNTER — Ambulatory Visit (HOSPITAL_BASED_OUTPATIENT_CLINIC_OR_DEPARTMENT_OTHER)
Admission: RE | Admit: 2015-11-04 | Discharge: 2015-11-04 | Disposition: A | Discharge status: Home / Self Care | Payer: Medicare Other | Source: Ambulatory Visit | Attending: Family | Admitting: Family

## 2015-11-04 DIAGNOSIS — Z1231 Encounter for screening mammogram for malignant neoplasm of breast: Secondary | ICD-10-CM | POA: Diagnosis not present

## 2015-11-04 DIAGNOSIS — Z1239 Encounter for other screening for malignant neoplasm of breast: Secondary | ICD-10-CM

## 2015-11-24 DIAGNOSIS — H524 Presbyopia: Secondary | ICD-10-CM | POA: Diagnosis not present

## 2015-11-25 DIAGNOSIS — N200 Calculus of kidney: Secondary | ICD-10-CM | POA: Diagnosis not present

## 2015-12-01 ENCOUNTER — Encounter: Payer: Self-pay | Admitting: Urology

## 2015-12-13 ENCOUNTER — Other Ambulatory Visit: Payer: Self-pay | Admitting: Family

## 2015-12-23 DIAGNOSIS — N201 Calculus of ureter: Secondary | ICD-10-CM | POA: Diagnosis not present

## 2015-12-23 DIAGNOSIS — Z0000 Encounter for general adult medical examination without abnormal findings: Secondary | ICD-10-CM | POA: Diagnosis not present

## 2016-01-01 DIAGNOSIS — H04123 Dry eye syndrome of bilateral lacrimal glands: Secondary | ICD-10-CM | POA: Diagnosis not present

## 2016-01-01 DIAGNOSIS — H26493 Other secondary cataract, bilateral: Secondary | ICD-10-CM | POA: Diagnosis not present

## 2016-01-01 DIAGNOSIS — H524 Presbyopia: Secondary | ICD-10-CM | POA: Diagnosis not present

## 2016-01-08 DIAGNOSIS — H26493 Other secondary cataract, bilateral: Secondary | ICD-10-CM | POA: Diagnosis not present

## 2016-01-22 DIAGNOSIS — H04123 Dry eye syndrome of bilateral lacrimal glands: Secondary | ICD-10-CM | POA: Diagnosis not present

## 2016-04-13 ENCOUNTER — Other Ambulatory Visit: Payer: Self-pay | Admitting: Family

## 2016-04-13 NOTE — Telephone Encounter (Signed)
Lexapro refill sent to pharmacy. Please call pt and let her know she is due for 6 month follow up with Melissa and schedule appointment soon. Thanks!

## 2016-04-16 NOTE — Telephone Encounter (Signed)
Pt has been scheduled.  °

## 2016-04-22 ENCOUNTER — Ambulatory Visit (INDEPENDENT_AMBULATORY_CARE_PROVIDER_SITE_OTHER): Payer: Medicare Other | Admitting: Family

## 2016-04-22 ENCOUNTER — Encounter: Payer: Self-pay | Admitting: Family

## 2016-04-22 VITALS — BP 120/68 | HR 76 | Temp 98.4°F | Ht 66.0 in | Wt 170.2 lb

## 2016-04-22 DIAGNOSIS — I10 Essential (primary) hypertension: Secondary | ICD-10-CM

## 2016-04-22 DIAGNOSIS — E039 Hypothyroidism, unspecified: Secondary | ICD-10-CM | POA: Diagnosis not present

## 2016-04-22 DIAGNOSIS — F341 Dysthymic disorder: Secondary | ICD-10-CM

## 2016-04-22 NOTE — Assessment & Plan Note (Signed)
Depression has worsened recently due to grief reaction.  We discussed referral to a therapist but she declines at this time. Continue lexapro. Advised pt to let me know if her symptoms worsen or do not improve as we may need to add a medication to her regimen.

## 2016-04-22 NOTE — Progress Notes (Signed)
Subjective:    Patient ID: Mallory Hardy, female    DOB: 18-Feb-1935, 81 y.o.   MRN: [id number]  HPI  Ms. Top is a 81 yr old female who presents today for follow up.  HTN- on lisinopril, hctz.   BP Readings from Last 3 Encounters:  04/22/16 120/68  10/01/15 132/70  09/02/15 135/66   Hypothyroid- on synthroid 50 mcg. Feeling well on currently.  Lab Results  Component Value Date   TSH 0.61 10/01/2015   Depression- maintained on lexapro. Close female friend passed in February. Son lives with her and his wife.  Brother died recently on 18-Apr-2023. Nephew has pancreatic cancer.  Finds herself wanting to sleep more.   Review of Systems  Respiratory: Negative for shortness of breath.   Cardiovascular: Negative for chest pain and leg swelling.   Past Medical History  Diagnosis Date  . Migraine headache   . Anxiety   . Depression   . Diverticulosis   . Hypertension   . Hiatal hernia   . Hypothyroidism, postsurgical   . History of kidney stones   . History of squamous cell carcinoma excision   . Arthritis     SHOULDERS  . Seasonal allergic rhinitis   . Gross hematuria   . Urethral caruncle   . Right ureteral stone   . Lesion of bladder   . Nephrolithiasis     RIGHT SIDE  . History of esophageal dilatation     FOR STRICTURE  . Complication of anesthesia     SLOW TO WAKE  . PONV (postoperative nausea and vomiting)   . Lower urinary tract symptoms (LUTS)   . SUI (stress urinary incontinence, female)   . Wears partial dentures     lower  . Mild acid reflux     no meds//  but made changes with diet and eating times     Social History   Social History  . Marital Status: Widowed    Spouse Name: N/A  . Number of Children: 2  . Years of Education: N/A   Occupational History  . Retired    Social History Main Topics  . Smoking status: Never Smoker   . Smokeless tobacco: Never Used  . Alcohol Use: No  . Drug Use: No  . Sexual Activity: Not on file   Other Topics  Concern  . Not on file   Social History Narrative   Lives with son and his wife.      Past Surgical History  Procedure Laterality Date  . Thyroidectomy  1959    goiter  . Abdominal exploration surgery  1959    r/o endometriosis  . Vaginal hysterectomy  1978  . Exploratory laparotomy w/ bilateral salpingoophorectomy  08-21-2002  . Shoulder arthroscopy with open rotator cuff repair and distal clavicle acrominectomy Right 06-14-2006  . Anterior and posterior repair  06-03-2003  ("bladder tack")    AND CYSTO/  EXCISION URETHRAL CARUNCLE   . Extracorporeal shock wave lithotripsy  x5  1990--2010  . Multiple vaginal lesion bx's  08-31-2001  . Cysto/  periuretheral excision and bx  11-25-2009  . Cataract extraction w/ intraocular lens  implant, bilateral    . Cystoscopy with biopsy N/A 09/02/2015    Procedure: CYSTOSCOPY WITH BIOPSY;  Surgeon: Nickie Retort, MD;  Location: [facility];  Service: Urology;  Laterality: N/A;  . Cystoscopy w/ ureteral stent placement Right 09/02/2015    Procedure: RIGHT URETEROSCOPY WITH RETROGRADE PYELOGRAM/ AND URETERAL STENT PLACEMENT;  Surgeon: Nickie Retort, MD;  Location: [facility];  Service: Urology;  Laterality: Right;  . Holmium laser application Right [id number]    Procedure: LASER LITHOTRIPSY RIGHT;  Surgeon: Nickie Retort, MD;  Location: [facility];  Service: Urology;  Laterality: Right;    Family History  Problem Relation Age of Onset  . Cancer Neg Hx   . Diabetes Neg Hx   . Heart attack Neg Hx   . Hyperlipidemia Neg Hx   . Sudden death Neg Hx   . Colon cancer Neg Hx   . Hypertension Mother   . Atrial fibrillation Sister     No Known Allergies  Current Outpatient Prescriptions on File Prior to Visit  Medication Sig Dispense Refill  . acetaminophen (TYLENOL) 650 MG CR tablet Take by mouth. 1-2 tablets daily PRN for pain    . aspirin 81 MG tablet Take 81 mg by mouth 3 (three)  times a week.     . Biotin 5000 MCG CAPS Take 2 capsules by mouth every evening.    . Calcium Carbonate-Vitamin D (CALTRATE 600+D) 600-400 MG-UNIT per tablet Take 1 tablet by mouth daily. (Patient taking differently: Take 1 tablet by mouth every morning. )    . Cholecalciferol (VITAMIN D3) 5000 UNITS CAPS Take 2 capsules by mouth every evening.    . diphenhydrAMINE (BENADRYL ALLERGY) 25 mg capsule Take 25 mg by mouth at bedtime.     . docusate sodium (COLACE) 100 MG capsule Take 1 capsule (100 mg total) by mouth 2 (two) times daily. (Patient taking differently: Take 100 mg by mouth daily. ) 10 capsule 0  . escitalopram (LEXAPRO) 10 MG tablet TAKE ONE TABLET BY MOUTH ONCE DAILY 90 tablet 0  . hydrochlorothiazide (MICROZIDE) 12.5 MG capsule Take 1 capsule (12.5 mg total) by mouth every morning. 90 capsule 1  . levothyroxine (SYNTHROID, LEVOTHROID) 50 MCG tablet TAKE ONE TABLET BY MOUTH ONCE DAILY 90 tablet 1  . lisinopril (PRINIVIL,ZESTRIL) 2.5 MG tablet Take 1 tablet (2.5 mg total) by mouth every morning. 90 tablet 1  . Multiple Vitamins-Minerals (MULTIVITAMIN WITH MINERALS) tablet Take 1 tablet by mouth every morning.     . diclofenac sodium (VOLTAREN) 1 % GEL Apply 2 g topically 3 (three) times daily as needed (for neck/shoulder pain.). Reported on 04/22/2016     No current facility-administered medications on file prior to visit.    BP 120/68 mmHg  Pulse 76  Temp(Src) 98.4 F (36.9 C) (Oral)  Ht 5\' 6"  (1.676 m)  Wt 170 lb 4 oz (77.225 kg)  BMI 27.49 kg/m2  SpO2 96%       Objective:   Physical Exam  Constitutional: She is oriented to person, place, and time. She appears well-developed and well-nourished. No distress.  Neurological: She is alert and oriented to person, place, and time.  Skin: Skin is warm.  Psychiatric: Her behavior is normal. Judgment and thought content normal.  Briefly tearful.            Assessment & Plan:

## 2016-04-22 NOTE — Assessment & Plan Note (Signed)
Stable on synthroid. Continue same obtain TSH.

## 2016-04-22 NOTE — Assessment & Plan Note (Signed)
BP stable on current meds. Continue same, obtain bmet.  

## 2016-04-22 NOTE — Patient Instructions (Signed)
Please complete lab work prior to leaving.  Continue current medications.   

## 2016-04-22 NOTE — Progress Notes (Signed)
Pre visit review using our clinic review tool, if applicable. No additional management support is needed unless otherwise documented below in the visit note. 

## 2016-04-23 ENCOUNTER — Encounter: Payer: Self-pay | Admitting: Family

## 2016-04-23 LAB — TSH: TSH: 0.67 u[IU]/mL (ref 0.35–4.50)

## 2016-04-23 LAB — BASIC METABOLIC PANEL
BUN: 29 mg/dL — ABNORMAL HIGH (ref 6–23)
CALCIUM: 9.6 mg/dL (ref 8.4–10.5)
CO2: 29 meq/L (ref 19–32)
Chloride: 102 mEq/L (ref 96–112)
Creatinine, Ser: 0.95 mg/dL (ref 0.40–1.20)
GFR: 60.24 mL/min (ref >60.00)
Glucose, Bld: 68 mg/dL — ABNORMAL LOW (ref 70–99)
POTASSIUM: 3.6 meq/L (ref 3.5–5.1)
SODIUM: 139 meq/L (ref 135–145)

## 2016-05-07 ENCOUNTER — Other Ambulatory Visit: Payer: Self-pay | Admitting: Family

## 2016-05-07 NOTE — Telephone Encounter (Signed)
Rx request to pharmacy/[facility]  

## 2016-05-25 DIAGNOSIS — R1084 Generalized abdominal pain: Secondary | ICD-10-CM | POA: Diagnosis not present

## 2016-05-25 DIAGNOSIS — N201 Calculus of ureter: Secondary | ICD-10-CM | POA: Diagnosis not present

## 2016-05-25 DIAGNOSIS — R3121 Asymptomatic microscopic hematuria: Secondary | ICD-10-CM | POA: Diagnosis not present

## 2016-05-28 DIAGNOSIS — Z0000 Encounter for general adult medical examination without abnormal findings: Secondary | ICD-10-CM | POA: Diagnosis not present

## 2016-06-14 DIAGNOSIS — N200 Calculus of kidney: Secondary | ICD-10-CM | POA: Diagnosis not present

## 2016-06-28 ENCOUNTER — Other Ambulatory Visit: Payer: Self-pay | Admitting: Family

## 2016-07-27 ENCOUNTER — Other Ambulatory Visit: Payer: Self-pay | Admitting: Family

## 2016-08-03 ENCOUNTER — Other Ambulatory Visit: Payer: Self-pay | Admitting: Family

## 2016-08-04 ENCOUNTER — Encounter: Payer: Self-pay | Admitting: Gastroenterology

## 2016-08-13 ENCOUNTER — Other Ambulatory Visit: Payer: Self-pay | Admitting: Family

## 2016-08-13 NOTE — Telephone Encounter (Signed)
Pt is to follow up with 10/23/2016 so refille has been ordered until then. TL/CMA

## 2016-10-20 ENCOUNTER — Telehealth: Payer: Self-pay | Admitting: *Deleted

## 2016-10-20 ENCOUNTER — Ambulatory Visit (INDEPENDENT_AMBULATORY_CARE_PROVIDER_SITE_OTHER): Payer: Medicare Other | Admitting: Family

## 2016-10-20 ENCOUNTER — Encounter: Payer: Self-pay | Admitting: Family

## 2016-10-20 ENCOUNTER — Ambulatory Visit: Payer: Medicare Other | Admitting: Family

## 2016-10-20 VITALS — BP 139/63 | HR 69 | Temp 98.3°F | Resp 20 | Ht 66.0 in | Wt 171.2 lb

## 2016-10-20 DIAGNOSIS — F341 Dysthymic disorder: Secondary | ICD-10-CM | POA: Diagnosis not present

## 2016-10-20 DIAGNOSIS — J069 Acute upper respiratory infection, unspecified: Secondary | ICD-10-CM

## 2016-10-20 DIAGNOSIS — I10 Essential (primary) hypertension: Secondary | ICD-10-CM | POA: Diagnosis not present

## 2016-10-20 DIAGNOSIS — R5383 Other fatigue: Secondary | ICD-10-CM

## 2016-10-20 DIAGNOSIS — E039 Hypothyroidism, unspecified: Secondary | ICD-10-CM

## 2016-10-20 DIAGNOSIS — B9789 Other viral agents as the cause of diseases classified elsewhere: Secondary | ICD-10-CM

## 2016-10-20 LAB — COMPREHENSIVE METABOLIC PANEL
ALT: 21 U/L (ref 0–35)
AST: 22 U/L (ref 0–37)
Albumin: 4.4 g/dL (ref 3.5–5.2)
Alkaline Phosphatase: 74 U/L (ref 39–117)
BILIRUBIN TOTAL: 0.5 mg/dL (ref 0.2–1.2)
BUN: 28 mg/dL — ABNORMAL HIGH (ref 6–23)
CALCIUM: 9.8 mg/dL (ref 8.4–10.5)
CO2: 31 meq/L (ref 19–32)
Chloride: 102 mEq/L (ref 96–112)
Creatinine, Ser: 0.77 mg/dL (ref 0.40–1.20)
GFR: 76.67 mL/min (ref >60.00)
Glucose, Bld: 95 mg/dL (ref 70–99)
Potassium: 4 mEq/L (ref 3.5–5.1)
Sodium: 139 mEq/L (ref 135–145)
Total Protein: 7.4 g/dL (ref 6.0–8.3)

## 2016-10-20 LAB — TSH: TSH: 0.87 u[IU]/mL (ref 0.35–4.50)

## 2016-10-20 MED ORDER — LISINOPRIL 2.5 MG PO TABS: ORAL_TABLET | ORAL | 1 refills | Status: DC

## 2016-10-20 MED ORDER — ESCITALOPRAM OXALATE 10 MG PO TABS: 10.0000 mg | ORAL_TABLET | Freq: Every day | ORAL | 1 refills | Status: DC

## 2016-10-20 MED ORDER — HYDROCHLOROTHIAZIDE 12.5 MG PO CAPS: ORAL_CAPSULE | ORAL | 1 refills | Status: DC

## 2016-10-20 MED ORDER — ESCITALOPRAM OXALATE 20 MG PO TABS: 20.0000 mg | ORAL_TABLET | Freq: Every day | ORAL | 1 refills | Status: DC

## 2016-10-20 MED ORDER — LEVOTHYROXINE SODIUM 50 MCG PO TABS: 50.0000 ug | ORAL_TABLET | Freq: Every day | ORAL | 1 refills | Status: DC

## 2016-10-20 NOTE — Progress Notes (Signed)
Pre visit review using our clinic review tool, if applicable. No additional management support is needed unless otherwise documented below in the visit note. 

## 2016-10-20 NOTE — Patient Instructions (Signed)
Please increase lexapro from 10mg  to 20mg . Please contact [facility] to make an appointment with a therapist: [phone].

## 2016-10-20 NOTE — Telephone Encounter (Signed)
Per verbal from PCP, cancelled lexapro 10mg  with Loma Sousa at [facility] and resent Lexapro 20mg .

## 2016-10-20 NOTE — Progress Notes (Addendum)
Subjective:    Patient ID: Mallory Hardy, female    DOB: 08-24-1935, 82 y.o.   MRN: [id number]  HPI  Mallory Hardy is a 82 yr old female who presents today with c/o sore throat, nasal congestion, dry cough- began yesterday.  1) HTN- patient is maintained on low dose hctz and lisinopril.   BP Readings from Last 3 Encounters:  10/20/16 139/63  04/22/16 120/68  10/01/15 132/70   2) Hypothyroid-  Maintained on synthroid.   Lab Results  Component Value Date   TSH 0.67 04/22/2016   3) Depression-  Currently maintained on lexapro 10 mg.  She notes that she continues to grieve and feel very sad about the loss of her significant other 6 weeks ago. Notes that she is "not getting better" as she had hoped to.      Review of Systems    see HPI  Past Medical History:  Diagnosis Date  . Anxiety   . Arthritis    SHOULDERS  . Complication of anesthesia    SLOW TO WAKE  . Depression   . Diverticulosis   . Gross hematuria   . Hiatal hernia   . History of esophageal dilatation    FOR STRICTURE  . History of kidney stones   . History of squamous cell carcinoma excision   . Hypertension   . Hypothyroidism, postsurgical   . Lesion of bladder   . Lower urinary tract symptoms (LUTS)   . Migraine headache   . Mild acid reflux    no meds//  but made changes with diet and eating times  . Nephrolithiasis    RIGHT SIDE  . PONV (postoperative nausea and vomiting)   . Right ureteral stone   . Seasonal allergic rhinitis   . SUI (stress urinary incontinence, female)   . Urethral caruncle   . Wears partial dentures    lower     Social History   Social History  . Marital status: Widowed    Spouse name: N/A  . Number of children: 2  . Years of education: N/A   Occupational History  . Retired Retired   Social History Main Topics  . Smoking status: Never Smoker  . Smokeless tobacco: Never Used  . Alcohol use No  . Drug use: No  . Sexual activity: Not on file   Other Topics  Concern  . Not on file   Social History Narrative   Lives with son and his wife.      Past Surgical History:  Procedure Laterality Date  . ABDOMINAL EXPLORATION SURGERY  1959   r/o endometriosis  . ANTERIOR AND POSTERIOR REPAIR  06-03-2003  ("bladder tack")   AND CYSTO/  EXCISION URETHRAL CARUNCLE   . CATARACT EXTRACTION W/ INTRAOCULAR LENS  IMPLANT, BILATERAL    . CYSTO/  PERIURETHERAL EXCISION AND BX  11-25-2009  . CYSTOSCOPY W/ URETERAL STENT PLACEMENT Right 09/02/2015   Procedure: RIGHT URETEROSCOPY WITH RETROGRADE PYELOGRAM/ AND URETERAL STENT PLACEMENT;  Surgeon: Nickie Retort, MD;  Location: [facility];  Service: Urology;  Laterality: Right;  . CYSTOSCOPY WITH BIOPSY N/A 09/02/2015   Procedure: CYSTOSCOPY WITH BIOPSY;  Surgeon: Nickie Retort, MD;  Location: [facility];  Service: Urology;  Laterality: N/A;  . EXPLORATORY LAPAROTOMY W/ BILATERAL SALPINGOOPHORECTOMY  08-21-2002  . EXTRACORPOREAL SHOCK WAVE LITHOTRIPSY  x5  1990--2010  . HOLMIUM LASER APPLICATION Right [id number]   Procedure: LASER LITHOTRIPSY RIGHT;  Surgeon: Nickie Retort, MD;  Location:  [place];  Service: Urology;  Laterality: Right;  . MULTIPLE VAGINAL LESION BX'S  08-31-2001  . SHOULDER ARTHROSCOPY WITH OPEN ROTATOR CUFF REPAIR AND DISTAL CLAVICLE ACROMINECTOMY Right 06-14-2006  . THYROIDECTOMY  1959   goiter  . VAGINAL HYSTERECTOMY  1978    Family History  Problem Relation Age of Onset  . Cancer Neg Hx   . Diabetes Neg Hx   . Heart attack Neg Hx   . Hyperlipidemia Neg Hx   . Sudden death Neg Hx   . Colon cancer Neg Hx   . Hypertension Mother   . Atrial fibrillation Sister     No Known Allergies  Current Outpatient Prescriptions on File Prior to Visit  Medication Sig Dispense Refill  . acetaminophen (TYLENOL) 650 MG CR tablet Take by mouth. 1-2 tablets daily PRN for pain    . aspirin 81 MG tablet Take 81 mg by mouth 3 (three)  times a week.     . Biotin 5000 MCG CAPS Take 2 capsules by mouth every evening.    . Calcium Carbonate-Vitamin D (CALTRATE 600+D) 600-400 MG-UNIT per tablet Take 1 tablet by mouth daily. (Patient taking differently: Take 1 tablet by mouth every morning. )    . Cholecalciferol (VITAMIN D3) 5000 UNITS CAPS Take 2 capsules by mouth every evening.    . diclofenac sodium (VOLTAREN) 1 % GEL Apply 2 g topically 3 (three) times daily as needed (for neck/shoulder pain.). Reported on 04/22/2016    . docusate sodium (COLACE) 100 MG capsule Take 1 capsule (100 mg total) by mouth 2 (two) times daily. (Patient taking differently: Take 100 mg by mouth daily. ) 10 capsule 0  . escitalopram (LEXAPRO) 10 MG tablet TAKE ONE TABLET BY MOUTH ONCE DAILY 90 tablet 1  . hydrochlorothiazide (MICROZIDE) 12.5 MG capsule TAKE ONE CAPSULE BY MOUTH ONCE DAILY IN THE MORNING 90 capsule 0  . levothyroxine (SYNTHROID, LEVOTHROID) 50 MCG tablet TAKE ONE TABLET BY MOUTH ONCE DAILY 90 tablet 1  . lisinopril (PRINIVIL,ZESTRIL) 2.5 MG tablet TAKE ONE TABLET BY MOUTH ONCE DAILY IN THE MORNING 90 tablet 0  . Multiple Vitamins-Minerals (MULTIVITAMIN WITH MINERALS) tablet Take 1 tablet by mouth every morning.      No current facility-administered medications on file prior to visit.     BP 139/63 (BP Location: Right Arm, Cuff Size: Normal)   Pulse 69   Temp 98.3 F (36.8 C) (Oral)   Resp 20   Ht 5\' 6"  (1.676 m)   Wt 171 lb 3.2 oz (77.7 kg)   SpO2 98% Comment: room air  BMI 27.63 kg/m    Objective:   Physical Exam  Constitutional: She is oriented to person, place, and time. She appears well-developed and well-nourished.  HENT:  Head: Normocephalic and atraumatic.  Right Ear: Tympanic membrane and ear canal normal.  Left Ear: Tympanic membrane and ear canal normal.  Mouth/Throat: No oropharyngeal exudate, posterior oropharyngeal edema or posterior oropharyngeal erythema.  Cardiovascular: Normal rate, regular rhythm and normal  heart sounds.   No murmur heard. Pulmonary/Chest: Effort normal and breath sounds normal. No respiratory distress. She has no wheezes.  Neurological: She is alert and oriented to person, place, and time.  Psychiatric: She has a normal mood and affect. Her behavior is normal. Judgment and thought content normal.          Assessment & Plan:  Viral URI- advised pt to call if symptoms fail to improve in the next few days.

## 2016-10-21 ENCOUNTER — Encounter: Payer: Self-pay | Admitting: Family

## 2016-10-21 NOTE — Assessment & Plan Note (Signed)
Stable on synthroid.  Continue same, obtain follow up tsh.

## 2016-10-21 NOTE — Assessment & Plan Note (Signed)
Uncontrolled. Will increase lexapro from 10mg  to 20mg  and I encouraged her to establish with a therapist.

## 2016-10-21 NOTE — Assessment & Plan Note (Signed)
Stable on current medications, continue same, obtain follow up bmet.

## 2016-11-16 ENCOUNTER — Other Ambulatory Visit: Payer: Self-pay | Admitting: Family

## 2016-11-16 DIAGNOSIS — Z1231 Encounter for screening mammogram for malignant neoplasm of breast: Secondary | ICD-10-CM

## 2016-11-18 ENCOUNTER — Ambulatory Visit (HOSPITAL_BASED_OUTPATIENT_CLINIC_OR_DEPARTMENT_OTHER)
Admission: RE | Admit: 2016-11-18 | Discharge: 2016-11-18 | Disposition: A | Discharge status: Home / Self Care | Payer: Medicare Other | Source: Ambulatory Visit | Attending: Family | Admitting: Family

## 2016-11-18 DIAGNOSIS — Z1231 Encounter for screening mammogram for malignant neoplasm of breast: Secondary | ICD-10-CM | POA: Insufficient documentation

## 2016-12-03 ENCOUNTER — Ambulatory Visit (INDEPENDENT_AMBULATORY_CARE_PROVIDER_SITE_OTHER): Payer: Medicare Other | Admitting: Family

## 2016-12-03 ENCOUNTER — Encounter: Payer: Self-pay | Admitting: Family

## 2016-12-03 VITALS — BP 122/72 | HR 76 | Temp 98.4°F | Ht 66.0 in | Wt 172.2 lb

## 2016-12-03 DIAGNOSIS — F32A Depression, unspecified: Secondary | ICD-10-CM

## 2016-12-03 DIAGNOSIS — N3281 Overactive bladder: Secondary | ICD-10-CM

## 2016-12-03 DIAGNOSIS — F329 Major depressive disorder, single episode, unspecified: Secondary | ICD-10-CM | POA: Diagnosis not present

## 2016-12-03 MED ORDER — HYDROCHLOROTHIAZIDE 12.5 MG PO CAPS: ORAL_CAPSULE | ORAL | 1 refills | Status: DC

## 2016-12-03 MED ORDER — MIRABEGRON ER 25 MG PO TB24: 25.0000 mg | ORAL_TABLET | Freq: Every day | ORAL | 3 refills | Status: DC

## 2016-12-03 NOTE — Patient Instructions (Signed)
Please complete lab work prior to leaving.  Begin myrbetriq for overactive bladder.

## 2016-12-03 NOTE — Progress Notes (Signed)
Pre visit review using our clinic review tool, if applicable. No additional management support is needed unless otherwise documented below in the visit note. 

## 2016-12-03 NOTE — Progress Notes (Signed)
Subjective:    Patient ID: Mallory Hardy, female    DOB: [birth year], 82 y.o.   MRN: [id number]  HPI  Mallory Hardy is an 82 yr old female who presents today for follow up of her depression.  Last visit her lexapro was increased from 10mg  to 20mg . Mallory Hardy reports that her mood has been better since the lexapro was increased. Notes that Mallory Hardy is less tearful and is now having some good days.  Mallory Hardy visited her significant other's grave since it was the 1 year anniversary of his death and reports that "I did much better than I thought I would."  Mallory Hardy is looking forward to the possibility of going down to [place] and staying with her granddaughter in the fall.    Mallory Hardy does report that Mallory Hardy is bothered by urinary leakage and dribbling.     Review of Systems See HPI  Past Medical History:  Diagnosis Date  . Anxiety   . Arthritis    SHOULDERS  . Complication of anesthesia    SLOW TO WAKE  . Depression   . Diverticulosis   . Gross hematuria   . Hiatal hernia   . History of esophageal dilatation    FOR STRICTURE  . History of kidney stones   . History of squamous cell carcinoma excision   . Hypertension   . Hypothyroidism, postsurgical   . Lesion of bladder   . Lower urinary tract symptoms (LUTS)   . Migraine headache   . Mild acid reflux    no meds//  but made changes with diet and eating times  . Nephrolithiasis    RIGHT SIDE  . PONV (postoperative nausea and vomiting)   . Right ureteral stone   . Seasonal allergic rhinitis   . SUI (stress urinary incontinence, female)   . Urethral caruncle   . Wears partial dentures    lower     Social History   Social History  . Marital status: Widowed    Spouse name: N/A  . Number of children: 2  . Years of education: N/A   Occupational History  . Retired Retired   Social History Main Topics  . Smoking status: Never Smoker  . Smokeless tobacco: Never Used  . Alcohol use No  . Drug use: No  . Sexual activity: Not on file   Other  Topics Concern  . Not on file   Social History Narrative   Lives with son and his wife.      Past Surgical History:  Procedure Laterality Date  . ABDOMINAL EXPLORATION SURGERY  1959   r/o endometriosis  . ANTERIOR AND POSTERIOR REPAIR  06-03-2003  ("bladder tack")   AND CYSTO/  EXCISION URETHRAL CARUNCLE   . CATARACT EXTRACTION W/ INTRAOCULAR LENS  IMPLANT, BILATERAL    . CYSTO/  PERIURETHERAL EXCISION AND BX  11-25-2009  . CYSTOSCOPY W/ URETERAL STENT PLACEMENT Right 09/02/2015   Procedure: RIGHT URETEROSCOPY WITH RETROGRADE PYELOGRAM/ AND URETERAL STENT PLACEMENT;  Surgeon: Nickie Retort, MD;  Location: [facility];  Service: Urology;  Laterality: Right;  . CYSTOSCOPY WITH BIOPSY N/A 09/02/2015   Procedure: CYSTOSCOPY WITH BIOPSY;  Surgeon: Nickie Retort, MD;  Location: [facility];  Service: Urology;  Laterality: N/A;  . EXPLORATORY LAPAROTOMY W/ BILATERAL SALPINGOOPHORECTOMY  08-21-2002  . EXTRACORPOREAL SHOCK WAVE LITHOTRIPSY  x5  1990--2010  . HOLMIUM LASER APPLICATION Right [id number]   Procedure: LASER LITHOTRIPSY RIGHT;  Surgeon: Nickie Retort, MD;  Location: [place]  [place];  Service: Urology;  Laterality: Right;  . MULTIPLE VAGINAL LESION BX'S  08-31-2001  . SHOULDER ARTHROSCOPY WITH OPEN ROTATOR CUFF REPAIR AND DISTAL CLAVICLE ACROMINECTOMY Right 06-14-2006  . THYROIDECTOMY  1959   goiter  . VAGINAL HYSTERECTOMY  1978    Family History  Problem Relation Age of Onset  . Cancer Neg Hx   . Diabetes Neg Hx   . Heart attack Neg Hx   . Hyperlipidemia Neg Hx   . Sudden death Neg Hx   . Colon cancer Neg Hx   . Hypertension Mother   . Atrial fibrillation Sister     No Known Allergies  Current Outpatient Prescriptions on File Prior to Visit  Medication Sig Dispense Refill  . acetaminophen (TYLENOL) 650 MG CR tablet Take by mouth. 1-2 tablets daily PRN for pain    . aspirin 81 MG tablet Take 81 mg by mouth 3  (three) times a week.     . Biotin 5000 MCG CAPS Take 2 capsules by mouth every evening.    . Calcium Carbonate-Vitamin D (CALTRATE 600+D) 600-400 MG-UNIT per tablet Take 1 tablet by mouth daily. (Patient taking differently: Take 1 tablet by mouth every morning. )    . Cholecalciferol (VITAMIN D3) 5000 UNITS CAPS Take 2 capsules by mouth every evening.    . diclofenac sodium (VOLTAREN) 1 % GEL Apply 2 g topically 3 (three) times daily as needed (for neck/shoulder pain.). Reported on 04/22/2016    . docusate sodium (COLACE) 100 MG capsule Take 1 capsule (100 mg total) by mouth 2 (two) times daily. 10 capsule 0  . escitalopram (LEXAPRO) 20 MG tablet Take 1 tablet (20 mg total) by mouth daily. 30 tablet 1  . famotidine (PEPCID) 20 MG tablet Take 20 mg by mouth daily.    . hydrochlorothiazide (MICROZIDE) 12.5 MG capsule TAKE ONE CAPSULE BY MOUTH ONCE DAILY IN THE MORNING 90 capsule 1  . levothyroxine (SYNTHROID, LEVOTHROID) 50 MCG tablet Take 1 tablet (50 mcg total) by mouth daily. 90 tablet 1  . lisinopril (PRINIVIL,ZESTRIL) 2.5 MG tablet TAKE ONE TABLET BY MOUTH ONCE DAILY IN THE MORNING 90 tablet 1  . Multiple Vitamins-Minerals (MULTIVITAMIN WITH MINERALS) tablet Take 1 tablet by mouth every morning.      No current facility-administered medications on file prior to visit.     BP 122/72 (BP Location: Right Arm, Patient Position: Sitting, Cuff Size: Normal)   Pulse 76   Temp 98.4 F (36.9 C) (Oral)   Ht 5\' 6"  (1.676 m)   Wt 172 lb 3.2 oz (78.1 kg)   SpO2 93%   BMI 27.79 kg/m       Objective:   Physical Exam  Constitutional: Mallory Hardy is oriented to person, place, and time. Mallory Hardy appears well-developed and well-nourished.  HENT:  Head: Normocephalic and atraumatic.  Cardiovascular: Normal rate, regular rhythm and normal heart sounds.   No murmur heard. Pulmonary/Chest: Effort normal and breath sounds normal. No respiratory distress. Mallory Hardy has no wheezes.  Neurological: Mallory Hardy is alert and oriented  to person, place, and time.  Psychiatric: Mallory Hardy has a normal mood and affect. Her behavior is normal. Judgment and thought content normal.          Assessment & Plan:  Overactive bladder- trial of myrbetriq.  Depression- stable on current dose of lexapro, continue same.    15 minutes spent with pt today. >50% of this time was spent counseling patient on overactive bladder, depression and their treatments.

## 2016-12-04 ENCOUNTER — Other Ambulatory Visit: Payer: Self-pay | Admitting: Family

## 2016-12-23 IMAGING — CT CT ABD-PELV W/ CM
2 of 5 series · 16 of 46 positions shown, 18 images · IV contrast (OMNIPAQUE 300)
Comparison: CT 10/24/2013

CLINICAL DATA: Left lower quadrant pain. Hematuria. Flexible
sigmoidoscopy yesterday with biopsies.

EXAM:
CT ABDOMEN AND PELVIS WITH CONTRAST
TECHNIQUE: Multidetector CT imaging of the abdomen and pelvis was performed
using the standard protocol following bolus administration of
intravenous contrast.
CONTRAST:  50mL OMNIPAQUE IOHEXOL 300 MG/ML SOLN, 100mL OMNIPAQUE
IOHEXOL 300 MG/ML SOLN

[Series 2: abd/pel with · axial · 0.74mm/px · z∈[-470,-90]mm · 13 of 86 slices shown, 15 images]
[im 5/86  soft-tissue]
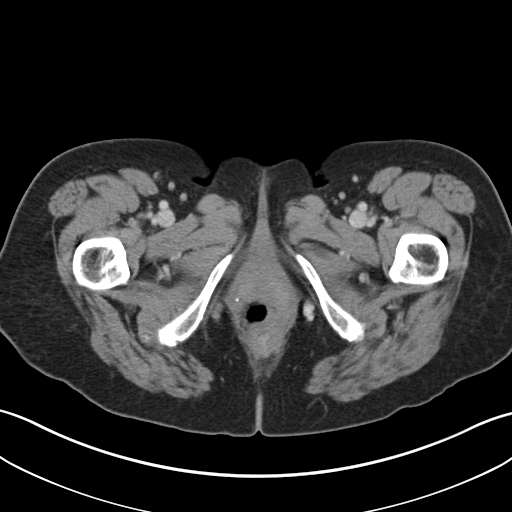
[im 5/86  bone]
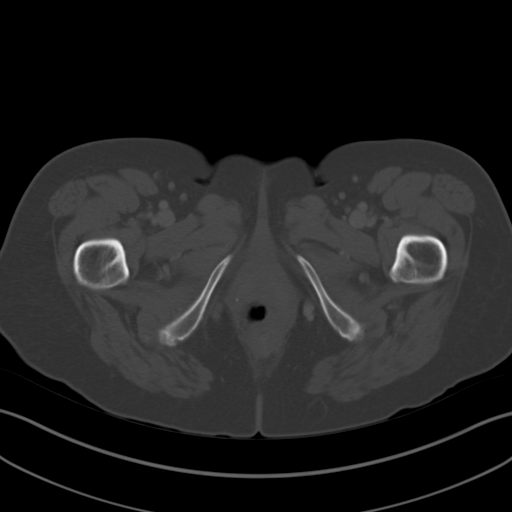
[im 10/86  soft-tissue]
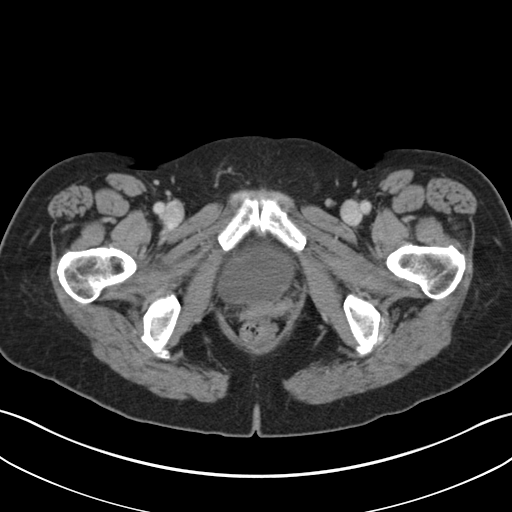
[im 19/86  soft-tissue]
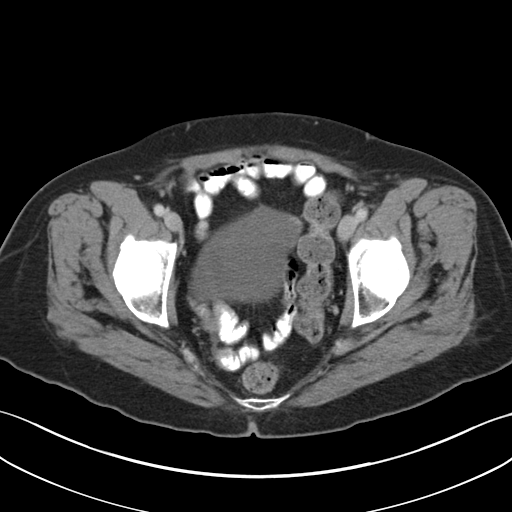
[im 24/86  soft-tissue]
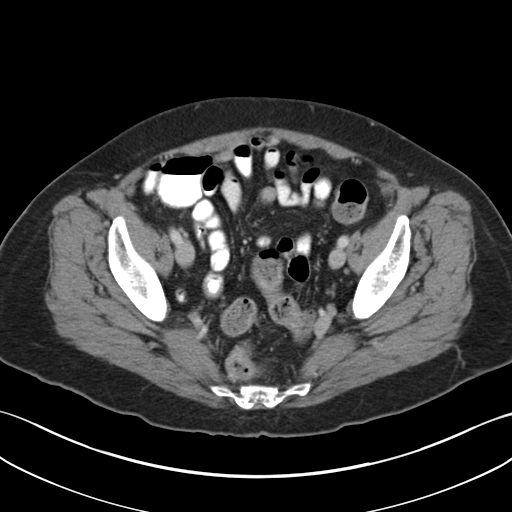
[im 29/86  soft-tissue]
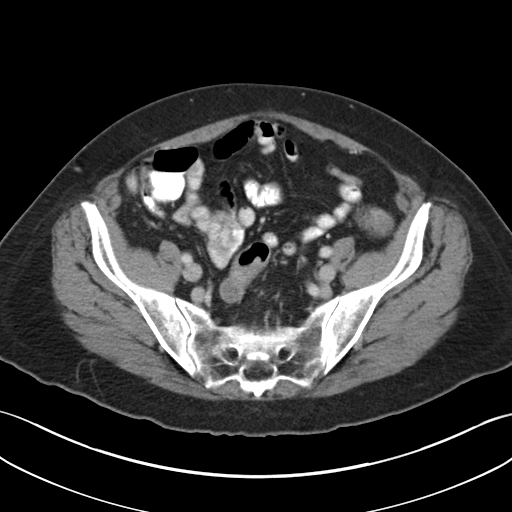
[im 38/86  soft-tissue]
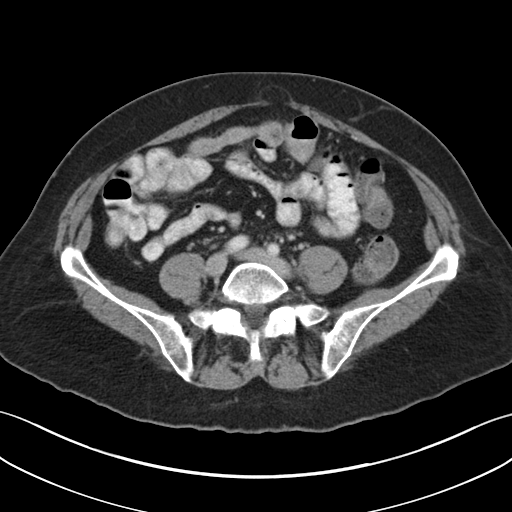
[im 43/86  soft-tissue]
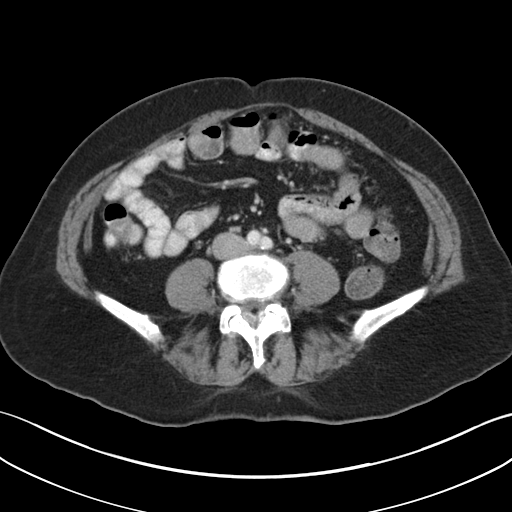
[im 48/86  soft-tissue]
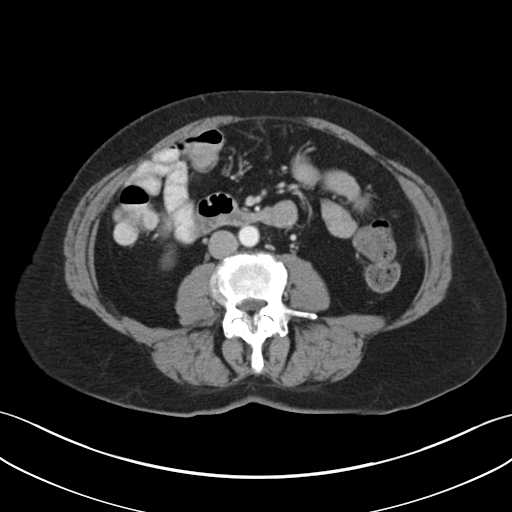
[im 57/86  soft-tissue]
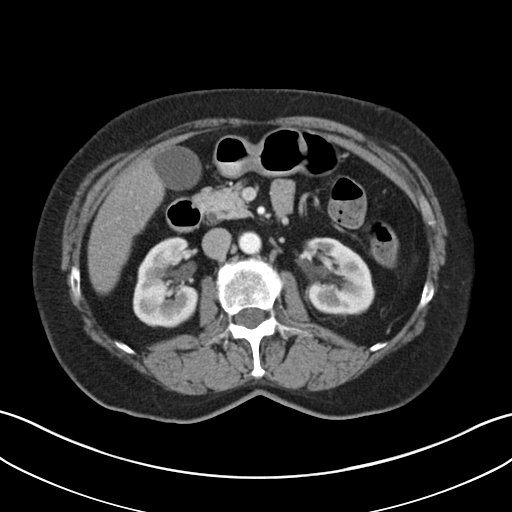
[im 57/86  bone]
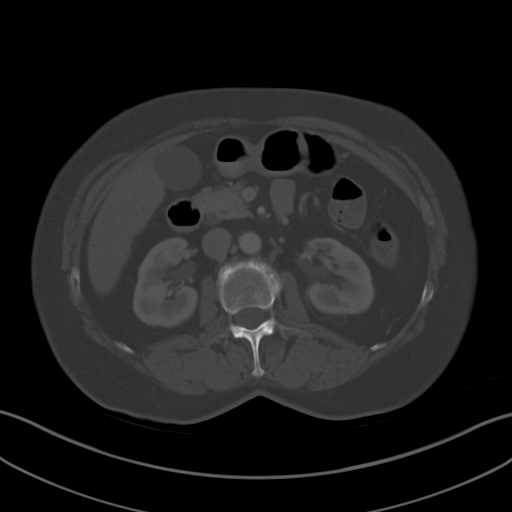
[im 62/86  soft-tissue]
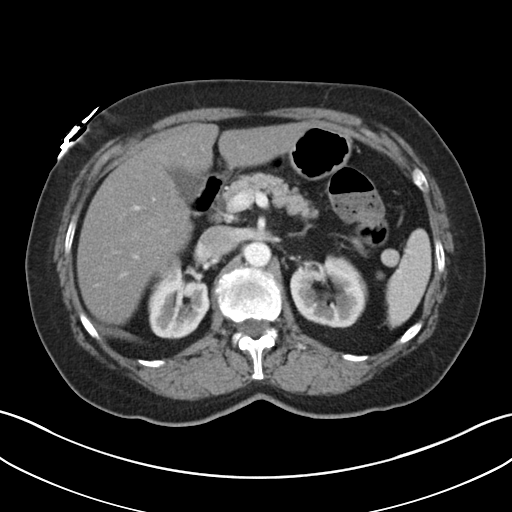
[im 67/86  soft-tissue]
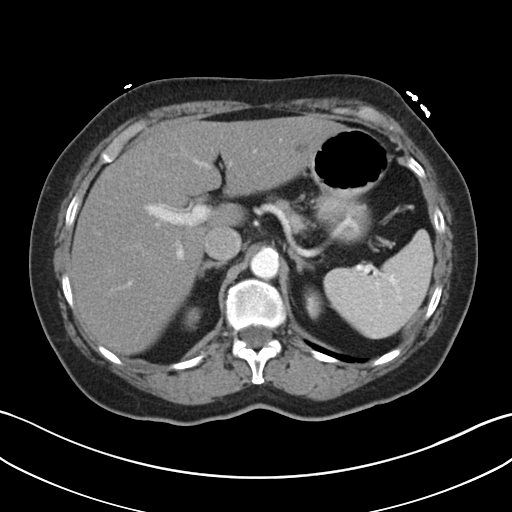
[im 76/86  soft-tissue]
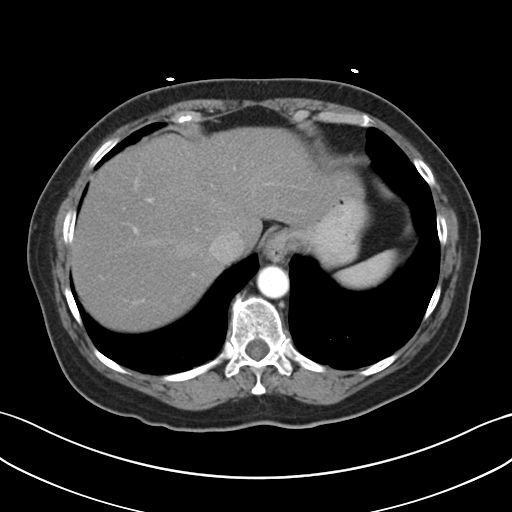
[im 81/86  soft-tissue]
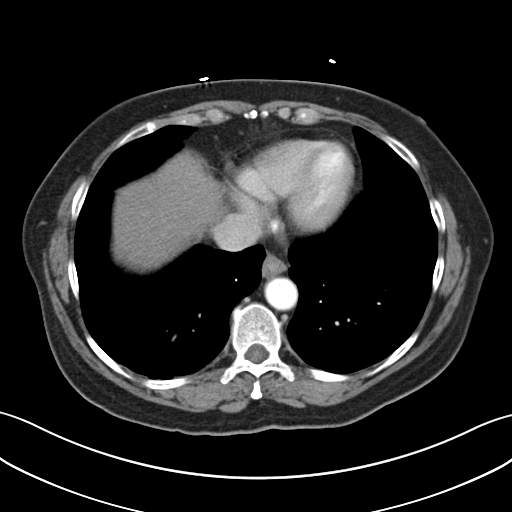

[Series 5: coronal a/|p · coronal · 0.74mm/px · 3 of 89 slices shown]
[im 30/89  soft-tissue]
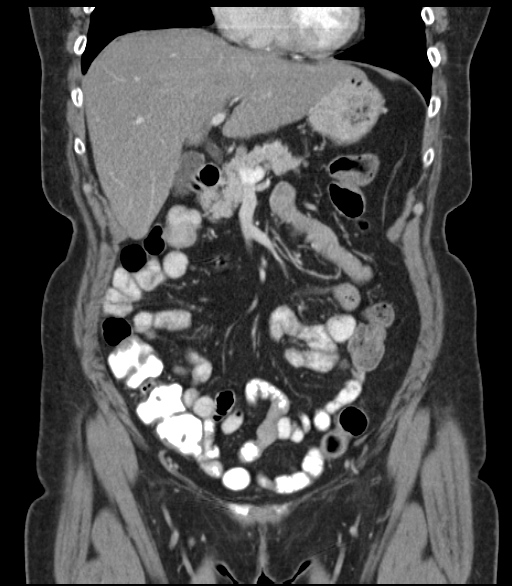
[im 40/89  soft-tissue]
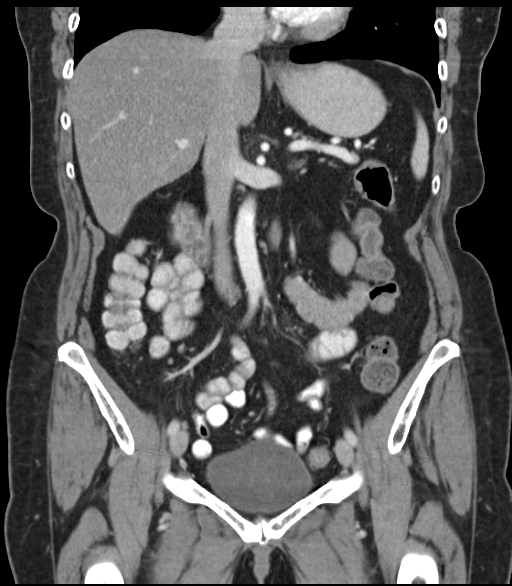
[im 49/89  soft-tissue]
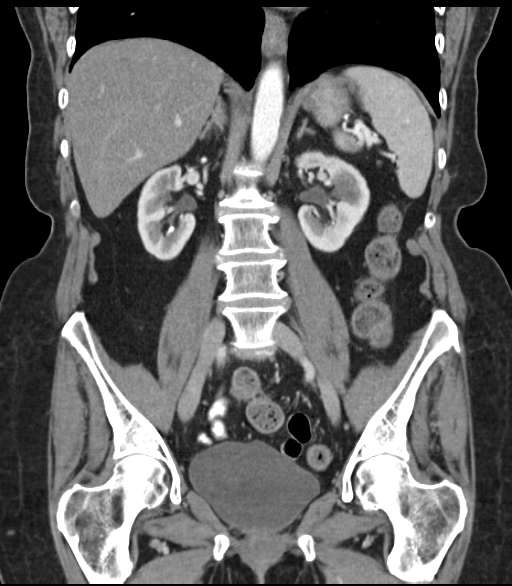

[16 of 46 positions shown; findings below may reference images not displayed]

FINDINGS: Lower chest:  The included lung bases are clear.

Liver: Decreased density consistent with steatosis. Small
subcentimeter hypodensity in the left hepatic lobe, unchanged from
prior.

Hepatobiliary: Gallbladder physiologically distended. No biliary
dilatation. No calcified stone.

Pancreas: Normal.

Spleen: Peripherally calcified density at the splenic hilum
measuring 7 mm likely represents small splenic artery aneurysm,
unchanged in size.

Adrenal glands: No nodule.

Kidneys: Symmetric renal enhancement. No hydronephrosis. There are
nonobstructing stones in the lower and upper right kidney. Symmetric
excretion on delayed phase imaging.

Stomach/Bowel: Small hiatal hernia. Stomach physiologically
distended. There are no dilated or thickened small bowel loops.
Small-moderate volume of stool throughout the colon without colonic
wall thickening. Particularly, no wall thickening involving the
sigmoid colon. The appendix is not confidently identified.

Vascular/Lymphatic: No retroperitoneal adenopathy. Abdominal aorta
is normal in caliber. Mild atherosclerosis without aneurysm.

Reproductive: The uterus is surgically absent. There is no adnexal
mass.

Bladder: Physiologically distended. No bladder wall thickening. No
intravesicular air.

Other: No free air, free fluid, or intra-abdominal fluid collection.
Small fat containing umbilical hernia.

Musculoskeletal: There are no acute or suspicious osseous
abnormalities. Mild degenerative change in the spine.
IMPRESSION: 1. No acute abnormality in the abdomen/pelvis.
2. Nonobstructing right renal stones.  Hepatic steatosis.

## 2017-01-11 ENCOUNTER — Telehealth: Payer: Self-pay | Admitting: Family

## 2017-01-11 NOTE — Telephone Encounter (Signed)
Called patient to schedule awv. lvm for patient to call office to schedule awv.

## 2017-03-04 ENCOUNTER — Encounter: Payer: Self-pay | Admitting: Family

## 2017-03-04 ENCOUNTER — Ambulatory Visit (INDEPENDENT_AMBULATORY_CARE_PROVIDER_SITE_OTHER): Payer: Medicare Other | Admitting: Family

## 2017-03-04 VITALS — BP 118/61 | HR 69 | Temp 98.1°F | Resp 16 | Ht 66.0 in | Wt 174.4 lb

## 2017-03-04 DIAGNOSIS — N3281 Overactive bladder: Secondary | ICD-10-CM | POA: Diagnosis not present

## 2017-03-04 DIAGNOSIS — Z0000 Encounter for general adult medical examination without abnormal findings: Secondary | ICD-10-CM | POA: Diagnosis not present

## 2017-03-04 DIAGNOSIS — Z780 Asymptomatic menopausal state: Secondary | ICD-10-CM | POA: Diagnosis not present

## 2017-03-04 DIAGNOSIS — F341 Dysthymic disorder: Secondary | ICD-10-CM | POA: Diagnosis not present

## 2017-03-04 MED ORDER — ESCITALOPRAM OXALATE 10 MG PO TABS: ORAL_TABLET | ORAL | 0 refills | Status: DC

## 2017-03-04 MED ORDER — TOLTERODINE TARTRATE 1 MG PO TABS: 1.0000 mg | ORAL_TABLET | Freq: Two times a day (BID) | ORAL | 3 refills | Status: DC

## 2017-03-04 NOTE — Assessment & Plan Note (Signed)
myrbetriq is too expensive. Will give trial of low dose detrol instead.

## 2017-03-04 NOTE — Assessment & Plan Note (Signed)
Stable/improved. Will decrease lexapro to 10mg  once daily and re-evaluate in 1 month. If stable, can consider further taper.

## 2017-03-04 NOTE — Patient Instructions (Addendum)
Stop myrbetriq, start detrol twice daily.  Decrease lexapro from 69m once daily to 111monce daily.  Purchase an otc wrist brace for your right hand to help with your carpal tunnel.    Mallory Hardy Thank you for taking time to come for your Medicare Wellness Visit. I appreciate your ongoing commitment to your health goals. Please review the following plan we discussed and let me know if I can assist you in the future.   Bring a copy of your advance directives to your next office visit.  These are the goals we discussed: Goals    . Weight (lb) < 170 lb (77.1 kg)       This is a list of the screening recommended for you and due dates:  Health Maintenance  Topic Date Due  . Flu Shot  05/18/2017  . Mammogram  11/18/2017  . Tetanus Vaccine  07/12/2023  . DEXA scan (bone density measurement)  Completed  . Pneumonia vaccines  Completed    Preventive Care 6539ears and Older, Female Preventive care refers to lifestyle choices and visits with your health care provider that can promote health and wellness. What does preventive care include?  A yearly physical exam. This is also called an annual well check.  Dental exams once or twice a year.  Routine eye exams. Ask your health care provider how often you should have your eyes checked.  Personal lifestyle choices, including:  Daily care of your teeth and gums.  Regular physical activity.  Eating a healthy diet.  Avoiding tobacco and drug use.  Limiting alcohol use.  Practicing safe sex.  Taking low-dose aspirin every day.  Taking vitamin and mineral supplements as recommended by your health care provider. What happens during an annual well check? The services and screenings done by your health care provider during your annual well check will depend on your age, overall health, lifestyle risk factors, and family history of disease. Counseling  Your health care provider may ask you questions about your:  Alcohol  use.  Tobacco use.  Drug use.  Emotional well-being.  Home and relationship well-being.  Sexual activity.  Eating habits.  History of falls.  Memory and ability to understand (cognition).  Work and work enStatistician Reproductive health. Screening  You may have the following tests or measurements:  Height, weight, and BMI.  Blood pressure.  Lipid and cholesterol levels. These may be checked every 5 years, or more frequently if you are over 5034ears old.  Skin check.  Lung cancer screening. You may have this screening every year starting at age 4622f you have a 30-pack-year history of smoking and currently smoke or have quit within the past 15 years.  Fecal occult blood test (FOBT) of the stool. You may have this test every year starting at age 80 Flexible sigmoidoscopy or colonoscopy. You may have a sigmoidoscopy every 5 years or a colonoscopy every 10 years starting at age 80 Hepatitis C blood test.  Hepatitis B blood test.  Sexually transmitted disease (STD) testing.  Diabetes screening. This is done by checking your blood sugar (glucose) after you have not eaten for a while (fasting). You may have this done every 1-3 years.  Bone density scan. This is done to screen for osteoporosis. You may have this done starting at age 80 Mammogram. This may be done every 1-2 years. Talk to your health care provider about how often you should have regular mammograms. Talk with your  health care provider about your test results, treatment options, and if necessary, the need for more tests. Vaccines  Your health care provider may recommend certain vaccines, such as:  Influenza vaccine. This is recommended every year.  Tetanus, diphtheria, and acellular pertussis (Tdap, Td) vaccine. You may need a Td booster every 10 years.  Varicella vaccine. You may need this if you have not been vaccinated.  Zoster vaccine. You may need this after age 6.  Measles, mumps, and  rubella (MMR) vaccine. You may need at least one dose of MMR if you were born in 1957 or later. You may also need a second dose.  Pneumococcal 13-valent conjugate (PCV13) vaccine. One dose is recommended after age 30.  Pneumococcal polysaccharide (PPSV23) vaccine. One dose is recommended after age 54.  Meningococcal vaccine. You may need this if you have certain conditions.  Hepatitis A vaccine. You may need this if you have certain conditions or if you travel or work in places where you may be exposed to hepatitis A.  Hepatitis B vaccine. You may need this if you have certain conditions or if you travel or work in places where you may be exposed to hepatitis B.  Haemophilus influenzae type b (Hib) vaccine. You may need this if you have certain conditions. Talk to your health care provider about which screenings and vaccines you need and how often you need them. This information is not intended to replace advice given to you by your health care provider. Make sure you discuss any questions you have with your health care provider. Document Released: 10/31/2015 Document Revised: 06/23/2016 Document Reviewed: 08/05/2015 Elsevier Interactive Patient Education  2017 [employer].

## 2017-03-04 NOTE — Progress Notes (Signed)
Subjective:    Patient ID: Mallory Hardy, female    DOB: 06-Nov-1934, 82 y.o.   MRN: [id number]  HPI  Mallory Hardy is an 82 yr old female who presents today for follow up.  OAB- was given trial of myrbetriq. She reports that the copay is too much for her although it is helping her symptoms. Reports that the pharmacist suggested oxybutynin instead.   Depression- maintained on lexapro. Reports that overall she is feeling much better. She would like to try weaning off of lexapro.  Wt Readings from Last 3 Encounters:  03/04/17 174 lb 6.4 oz (79.1 kg)  12/03/16 172 lb 3.2 oz (78.1 kg)  10/20/16 171 lb 3.2 oz (77.7 kg)      Review of Systems See HPI  Past Medical History:  Diagnosis Date  . Anxiety   . Arthritis    SHOULDERS  . Complication of anesthesia    SLOW TO WAKE  . Depression   . Diverticulosis   . Gross hematuria   . Hiatal hernia   . History of esophageal dilatation    FOR STRICTURE  . History of kidney stones   . History of squamous cell carcinoma excision   . Hypertension   . Hypothyroidism, postsurgical   . Lesion of bladder   . Lower urinary tract symptoms (LUTS)   . Migraine headache   . Mild acid reflux    no meds//  but made changes with diet and eating times  . Nephrolithiasis    RIGHT SIDE  . PONV (postoperative nausea and vomiting)   . Right ureteral stone   . Seasonal allergic rhinitis   . SUI (stress urinary incontinence, female)   . Urethral caruncle   . Wears partial dentures    lower     Social History   Social History  . Marital status: Widowed    Spouse name: N/A  . Number of children: 2  . Years of education: N/A   Occupational History  . Retired Retired   Social History Main Topics  . Smoking status: Never Smoker  . Smokeless tobacco: Never Used  . Alcohol use No  . Drug use: No  . Sexual activity: Not on file   Other Topics Concern  . Not on file   Social History Narrative   Lives with son and his wife.       Past Surgical History:  Procedure Laterality Date  . ABDOMINAL EXPLORATION SURGERY  1959   r/o endometriosis  . ANTERIOR AND POSTERIOR REPAIR  06-03-2003  ("bladder tack")   AND CYSTO/  EXCISION URETHRAL CARUNCLE   . CATARACT EXTRACTION W/ INTRAOCULAR LENS  IMPLANT, BILATERAL    . CYSTO/  PERIURETHERAL EXCISION AND BX  11-25-2009  . CYSTOSCOPY W/ URETERAL STENT PLACEMENT Right 09/02/2015   Procedure: RIGHT URETEROSCOPY WITH RETROGRADE PYELOGRAM/ AND URETERAL STENT PLACEMENT;  Surgeon: Nickie Retort, MD;  Location: [facility];  Service: Urology;  Laterality: Right;  . CYSTOSCOPY WITH BIOPSY N/A 09/02/2015   Procedure: CYSTOSCOPY WITH BIOPSY;  Surgeon: Nickie Retort, MD;  Location: [facility];  Service: Urology;  Laterality: N/A;  . EXPLORATORY LAPAROTOMY W/ BILATERAL SALPINGOOPHORECTOMY  08-21-2002  . EXTRACORPOREAL SHOCK WAVE LITHOTRIPSY  x5  1990--2010  . HOLMIUM LASER APPLICATION Right 27/74/1287   Procedure: LASER LITHOTRIPSY RIGHT;  Surgeon: Nickie Retort, MD;  Location: [facility];  Service: Urology;  Laterality: Right;  . MULTIPLE VAGINAL LESION BX'S  08-31-2001  . SHOULDER  ARTHROSCOPY WITH OPEN ROTATOR CUFF REPAIR AND DISTAL CLAVICLE ACROMINECTOMY Right 06-14-2006  . THYROIDECTOMY  1959   goiter  . VAGINAL HYSTERECTOMY  1978    Family History  Problem Relation Age of Onset  . Cancer Neg Hx   . Diabetes Neg Hx   . Heart attack Neg Hx   . Hyperlipidemia Neg Hx   . Sudden death Neg Hx   . Colon cancer Neg Hx   . Hypertension Mother   . Atrial fibrillation Sister     No Known Allergies  Current Outpatient Prescriptions on File Prior to Visit  Medication Sig Dispense Refill  . acetaminophen (TYLENOL) 650 MG CR tablet Take by mouth. 1-2 tablets daily PRN for pain    . aspirin 81 MG tablet Take 81 mg by mouth 3 (three) times a week.     . Biotin 5000 MCG CAPS Take 2 capsules by mouth every evening.    .  Calcium Carbonate-Vitamin D (CALTRATE 600+D) 600-400 MG-UNIT per tablet Take 1 tablet by mouth daily. (Patient taking differently: Take 1 tablet by mouth every morning. )    . Cholecalciferol (VITAMIN D3) 5000 UNITS CAPS Take 2 capsules by mouth every evening.    . diclofenac sodium (VOLTAREN) 1 % GEL Apply 2 g topically 3 (three) times daily as needed (for neck/shoulder pain.). Reported on 04/22/2016    . docusate sodium (COLACE) 100 MG capsule Take 1 capsule (100 mg total) by mouth 2 (two) times daily. 10 capsule 0  . hydrochlorothiazide (MICROZIDE) 12.5 MG capsule TAKE ONE CAPSULE BY MOUTH ONCE DAILY IN THE MORNING 90 capsule 1  . levothyroxine (SYNTHROID, LEVOTHROID) 50 MCG tablet Take 1 tablet (50 mcg total) by mouth daily. 90 tablet 1  . lisinopril (PRINIVIL,ZESTRIL) 2.5 MG tablet TAKE ONE TABLET BY MOUTH ONCE DAILY IN THE MORNING 90 tablet 1  . Multiple Vitamins-Minerals (MULTIVITAMIN WITH MINERALS) tablet Take 1 tablet by mouth every morning.      No current facility-administered medications on file prior to visit.     BP 118/61 (BP Location: Right Arm, Cuff Size: Normal)   Pulse 69   Temp 98.1 F (36.7 C) (Oral)   Resp 16   Ht 5\' 6"  (1.676 m)   Wt 174 lb 6.4 oz (79.1 kg)   SpO2 99%   BMI 28.15 kg/m       Objective:   Physical Exam  Constitutional: She is oriented to person, place, and time. She appears well-developed and well-nourished.  HENT:  Head: Normocephalic and atraumatic.  Cardiovascular: Normal rate, regular rhythm and normal heart sounds.   No murmur heard. Pulmonary/Chest: Effort normal and breath sounds normal. No respiratory distress. She has no wheezes.  Musculoskeletal: She exhibits no edema.  Lymphadenopathy:    She has no cervical adenopathy.  Neurological: She is alert and oriented to person, place, and time.  Skin: Skin is warm and dry.  Psychiatric: She has a normal mood and affect. Her behavior is normal. Judgment and thought content normal.           Assessment & Plan:

## 2017-03-04 NOTE — Progress Notes (Signed)
Subjective:   Mallory Hardy is a 82 y.o. female who presents for Medicare Annual (Subsequent) preventive examination.  Review of Systems:  No ROS.  Medicare Wellness Visit.  Cardiac Risk Factors include: advanced age (>22men, >90 women);hypertension;dyslipidemia  Sleep patterns: has difficulty falling asleep, feels rested on waking, gets up 1 times nightly to void and sleeps 5 hours nightly.   Home Safety/Smoke Alarms: Feels safe in home. Smoke alarms in place.  Living environment; residence and Firearm Safety: Lives alone. 1-story house/ trailer, can live on one level, firearms stored safely. Has basement but uses it mostly just for storage. Seat Belt Safety/Bike Helmet: Wears seat belt.   Counseling:   Eye Exam- Follows w/ Dr. Bing Plume.  Dental- Follows w/ [facility] every 6 months.  Female:   Pap- N/A, hysterectomy       Mammo- last 11/18/16. BI-RADS CATEGORY  1: Negative.        Dexa scan- last 10/16/14. Osteopenia.       CCS- Aged out.      Objective:     Vitals: BP 118/61 (BP Location: Right Arm, Cuff Size: Normal)   Pulse 69   Temp 98.1 F (36.7 C) (Oral)   Resp 16   Ht 5\' 6"  (1.676 m)   Wt 174 lb 6.4 oz (79.1 kg)   SpO2 99%   BMI 28.15 kg/m   Body mass index is 28.15 kg/m.  Wt Readings from Last 3 Encounters:  03/04/17 174 lb 6.4 oz (79.1 kg)  12/03/16 172 lb 3.2 oz (78.1 kg)  10/20/16 171 lb 3.2 oz (77.7 kg)   Tobacco History  Smoking Status  . Never Smoker  Smokeless Tobacco  . Never Used     Counseling given: Not Answered   Past Medical History:  Diagnosis Date  . Anxiety   . Arthritis    SHOULDERS  . Complication of anesthesia    SLOW TO WAKE  . Depression   . Diverticulosis   . Gross hematuria   . Hiatal hernia   . History of esophageal dilatation    FOR STRICTURE  . History of kidney stones   . History of squamous cell carcinoma excision   . Hypertension   . Hypothyroidism, postsurgical   . Lesion of bladder   .  Lower urinary tract symptoms (LUTS)   . Migraine headache   . Mild acid reflux    no meds//  but made changes with diet and eating times  . Nephrolithiasis    RIGHT SIDE  . PONV (postoperative nausea and vomiting)   . Right ureteral stone   . Seasonal allergic rhinitis   . SUI (stress urinary incontinence, female)   . Urethral caruncle   . Wears partial dentures    lower   Past Surgical History:  Procedure Laterality Date  . ABDOMINAL EXPLORATION SURGERY  1959   r/o endometriosis  . ANTERIOR AND POSTERIOR REPAIR  06-03-2003  ("bladder tack")   AND CYSTO/  EXCISION URETHRAL CARUNCLE   . CATARACT EXTRACTION W/ INTRAOCULAR LENS  IMPLANT, BILATERAL    . CYSTO/  PERIURETHERAL EXCISION AND BX  11-25-2009  . CYSTOSCOPY W/ URETERAL STENT PLACEMENT Right 09/02/2015   Procedure: RIGHT URETEROSCOPY WITH RETROGRADE PYELOGRAM/ AND URETERAL STENT PLACEMENT;  Surgeon: Nickie Retort, MD;  Location: [facility];  Service: Urology;  Laterality: Right;  . CYSTOSCOPY WITH BIOPSY N/A 09/02/2015   Procedure: CYSTOSCOPY WITH BIOPSY;  Surgeon: Nickie Retort, MD;  [facility];  Service: Urology;  Laterality: N/A;  . EXPLORATORY LAPAROTOMY W/ BILATERAL SALPINGOOPHORECTOMY  08-21-2002  . EXTRACORPOREAL SHOCK WAVE LITHOTRIPSY  x5  1990--2010  . HOLMIUM LASER APPLICATION Right 44/31/5400   Procedure: LASER LITHOTRIPSY RIGHT;  Surgeon: Nickie Retort, MD;  Location: [facility];  Service: Urology;  Laterality: Right;  . MULTIPLE VAGINAL LESION BX'S  08-31-2001  . SHOULDER ARTHROSCOPY WITH OPEN ROTATOR CUFF REPAIR AND DISTAL CLAVICLE ACROMINECTOMY Right 06-14-2006  . THYROIDECTOMY  1959   goiter  . VAGINAL HYSTERECTOMY  1978   Family History  Problem Relation Age of Onset  . Atrial fibrillation Sister   . Hypertension Mother   . Heart disease Sister   . Cancer Neg Hx   . Diabetes Neg Hx   . Heart attack Neg Hx   . Hyperlipidemia Neg Hx    . Sudden death Neg Hx   . Colon cancer Neg Hx    History  Sexual Activity  . Sexual activity: No    Outpatient Encounter Prescriptions as of 03/04/2017  Medication Sig  . acetaminophen (TYLENOL) 650 MG CR tablet Take by mouth. 1-2 tablets daily PRN for pain  . aspirin 81 MG tablet Take 81 mg by mouth 3 (three) times a week.   . Biotin 5000 MCG CAPS Take 2 capsules by mouth every evening.  . Calcium Carbonate Antacid (TUMS E-X PO) Take 2 each by mouth at bedtime.  . Calcium Carbonate-Vitamin D (CALTRATE 600+D) 600-400 MG-UNIT per tablet Take 1 tablet by mouth daily. (Patient taking differently: Take 1 tablet by mouth every morning. )  . Cholecalciferol (VITAMIN D3) 5000 UNITS CAPS Take 2 capsules by mouth every evening.  . diclofenac sodium (VOLTAREN) 1 % GEL Apply 2 g topically 3 (three) times daily as needed (for neck/shoulder pain.). Reported on 04/22/2016  . docusate sodium (COLACE) 100 MG capsule Take 1 capsule (100 mg total) by mouth 2 (two) times daily.  . hydrochlorothiazide (MICROZIDE) 12.5 MG capsule TAKE ONE CAPSULE BY MOUTH ONCE DAILY IN THE MORNING  . levothyroxine (SYNTHROID, LEVOTHROID) 50 MCG tablet Take 1 tablet (50 mcg total) by mouth daily.  Marland Kitchen lisinopril (PRINIVIL,ZESTRIL) 2.5 MG tablet TAKE ONE TABLET BY MOUTH ONCE DAILY IN THE MORNING  . Multiple Vitamins-Minerals (MULTIVITAMIN WITH MINERALS) tablet Take 1 tablet by mouth every morning.   . [DISCONTINUED] escitalopram (LEXAPRO) 20 MG tablet TAKE ONE TABLET BY MOUTH ONCE DAILY  . [DISCONTINUED] mirabegron ER (MYRBETRIQ) 25 MG TB24 tablet Take 1 tablet (25 mg total) by mouth daily.  Marland Kitchen escitalopram (LEXAPRO) 10 MG tablet One tablet by mouth once daily.  Marland Kitchen tolterodine (DETROL) 1 MG tablet Take 1 tablet (1 mg total) by mouth 2 (two) times daily.  . [DISCONTINUED] famotidine (PEPCID) 20 MG tablet Take 20 mg by mouth daily.   No facility-administered encounter medications on file as of 03/04/2017.     Activities of Daily  Living In your present state of health, do you have any difficulty performing the following activities: 03/04/2017  Hearing? N  Vision? N  Difficulty concentrating or making decisions? N  Walking or climbing stairs? N  Dressing or bathing? N  Doing errands, shopping? N  Preparing Food and eating ? N  Using the Toilet? N  In the past six months, have you accidently leaked urine? Y  Do you have problems with loss of bowel control? N  Managing your Medications? N  Managing your Finances? N  Housekeeping or managing your Housekeeping? N  Some  recent data might be hidden    Patient Care Team: Debbrah Alar, NP as PCP - General (Internal Medicine) Calvert Cantor, MD as Consulting Physician (Ophthalmology) Nickie Retort, MD as Consulting Physician (Urology)    Assessment:    Physical assessment deferred to PCP.  Exercise Activities and Dietary recommendations Current Exercise Habits: The patient does not participate in regular exercise at present  Diet (meal preparation, eat out, water intake, caffeinated beverages, dairy products, fruits and vegetables): in general, a "healthy" diet  , on average, 2 meals per day. Drinks [employer]. Snacks throughout the day on cheese, crackers, boiled eggs, etc. Describes diet as in between healthy and unhealthy. Drinks water throughout the day.  Goals    . Weight (lb) < 170 lb (77.1 kg)      Fall Risk Fall Risk  03/04/2017 10/20/2016 10/01/2015 08/07/2014 08/07/2014  Falls in the past year? No No No No No   Depression Screen PHQ 2/9 Scores 03/04/2017 10/20/2016 10/01/2015 08/07/2014  PHQ - 2 Score 0 0 0 1     Cognitive Function MMSE - Mini Mental State Exam 03/04/2017  Orientation to time 5  Orientation to Place 5  Registration 3  Attention/ Calculation 5  Recall 2  Language- name 2 objects 2  Language- repeat 1  Language- follow 3 step command 3  Language- read & follow direction 1  Write a sentence 1  Copy design 1  Total  score 29        Immunization History  Administered Date(s) Administered  . Influenza Split 07/07/2011, 08/17/2012  . Influenza Whole 07/28/2009, 07/16/2010  . Influenza, High Dose Seasonal PF 08/24/2016  . Influenza,inj,Quad PF,36+ Mos 06/27/2013, 08/07/2014  . Influenza-Unspecified 07/19/2015  . Pneumococcal Conjugate-13 05/01/2014  . Pneumococcal Polysaccharide-23 08/11/2006  . Tdap 07/11/2013  . Zoster 08/17/2012   Screening Tests Health Maintenance  Topic Date Due  . INFLUENZA VACCINE  05/18/2017  . MAMMOGRAM  11/18/2017  . TETANUS/TDAP  07/12/2023  . DEXA SCAN  Completed  . PNA vac Low Risk Adult  Completed      Plan:    Follow-up w/ PCP directed.   Bring a copy of your advance directives to your next office visit.  Orders placed for bone density.  I have personally reviewed and noted the following in the patient's chart:   . Medical and social history . Use of alcohol, tobacco or illicit drugs  . Current medications and supplements . Functional ability and status . Nutritional status . Physical activity . Advanced directives . List of other physicians . Vitals . Screenings to include cognitive, depression, and falls . Referrals and appointments  In addition, I have reviewed and discussed with patient certain preventive protocols, quality metrics, and best practice recommendations. A written personalized care plan for preventive services as well as general preventive health recommendations were provided to patient.     Dorrene German, RN  03/04/2017

## 2017-03-07 DIAGNOSIS — N200 Calculus of kidney: Secondary | ICD-10-CM | POA: Diagnosis not present

## 2017-03-08 ENCOUNTER — Telehealth: Payer: Self-pay | Admitting: *Deleted

## 2017-03-08 MED ORDER — OXYBUTYNIN CHLORIDE ER 5 MG PO TB24: 5.0000 mg | ORAL_TABLET | Freq: Every day | ORAL | 5 refills | Status: DC

## 2017-03-08 NOTE — Telephone Encounter (Signed)
Received fax from [facility] that tolterodine will require prior auth. Called [phone], ID# [id number] and spoke with [facility]. Reports that tolterodine is non-formulary and would be tier 6 if we are able to get approval.  Covered alternatives are:  Oxybutynin (regular release) 5mg , no quantity limit--tier 2, Oxybutynin 24h 5mg , 1 daily, 10 and 15mg  up to 2 per day are tier 3,  Vesicare 5 or 10mg  is tier 3 and  Mybetriq once a day is tier 3.  Please advise?

## 2017-03-08 NOTE — Telephone Encounter (Signed)
Spoke with pt. She states she will continue Myrbetriq, urologist just gave her 2 months supply of it. Pt was confused about which medication pharmacy told her was not covered by insurance. States that lexapro has been covered previously at low copay. Advised pt pharmacy had contacted [place] about Detrol (new bladder med) and it is not covered. Pt voices understanding. Notified Courtney at [facility] to cancel oxybutnin as pt will resume Myrbetriq. Med list has been updated.

## 2017-03-08 NOTE — Telephone Encounter (Signed)
rx sent for oxybutynin 24 hr.

## 2017-03-08 NOTE — Telephone Encounter (Signed)
Relation to pt: self  Call back number: [phone]    Reason for call:    Patient states urologist advised not to switch from myrbetriq to oxybutynin (DITROPAN-XL) 5 MG 24 hr tablet due to patient age and side effects.   Patient states anxiety medication (patient doesn't know the name) is to expensive, please advise

## 2017-03-15 ENCOUNTER — Ambulatory Visit (HOSPITAL_BASED_OUTPATIENT_CLINIC_OR_DEPARTMENT_OTHER)
Admission: RE | Admit: 2017-03-15 | Discharge: 2017-03-15 | Disposition: A | Discharge status: Home / Self Care | Payer: Medicare Other | Source: Ambulatory Visit | Attending: Family | Admitting: Family

## 2017-03-15 DIAGNOSIS — M85852 Other specified disorders of bone density and structure, left thigh: Secondary | ICD-10-CM | POA: Diagnosis not present

## 2017-03-15 DIAGNOSIS — Z780 Asymptomatic menopausal state: Secondary | ICD-10-CM | POA: Diagnosis not present

## 2017-03-17 ENCOUNTER — Encounter: Payer: Self-pay | Admitting: Family

## 2017-03-28 DIAGNOSIS — H04123 Dry eye syndrome of bilateral lacrimal glands: Secondary | ICD-10-CM | POA: Diagnosis not present

## 2017-04-01 ENCOUNTER — Ambulatory Visit (INDEPENDENT_AMBULATORY_CARE_PROVIDER_SITE_OTHER): Payer: Medicare Other | Admitting: Family

## 2017-04-01 ENCOUNTER — Encounter: Payer: Self-pay | Admitting: Family

## 2017-04-01 VITALS — BP 120/59 | HR 63 | Temp 98.0°F | Resp 16 | Ht 66.0 in | Wt 171.6 lb

## 2017-04-01 DIAGNOSIS — F341 Dysthymic disorder: Secondary | ICD-10-CM

## 2017-04-01 DIAGNOSIS — N3281 Overactive bladder: Secondary | ICD-10-CM | POA: Diagnosis not present

## 2017-04-01 MED ORDER — ESCITALOPRAM OXALATE 10 MG PO TABS: ORAL_TABLET | ORAL | 0 refills | Status: DC

## 2017-04-01 NOTE — Assessment & Plan Note (Signed)
Stable on lexapro 10mg . She wishes to taper off completely. Advised pt as follows:  Start lexapro 10mg  tabs- 1/2 tablet by mouth once daily for 2 weeks, then 1/2 tab every other day for 1 week, then stop

## 2017-04-01 NOTE — Progress Notes (Signed)
Subjective:    Patient ID: Mallory Hardy, female    DOB: 03-11-1935, 82 y.o.   MRN: [id number]  HPI  Ms.  Mallory Hardy is an 82 yr old female who presents today for follow up.  1) Depression/anxiety- last visit she felt that she was doing much better and wanted to try to come off of the lexapro. We decreased her lexapro from 20mg  to 10mg ,  2) overactive bladder- she was advised to stay on myrbetriq by urology.  Reports that this is working really well for her and she slept through the whole night yesterday without getting up to urinate.    Review of Systems See HPI  Past Medical History:  Diagnosis Date  . Anxiety   . Arthritis    SHOULDERS  . Complication of anesthesia    SLOW TO WAKE  . Depression   . Diverticulosis   . Gross hematuria   . Hiatal hernia   . History of esophageal dilatation    FOR STRICTURE  . History of kidney stones   . History of squamous cell carcinoma excision   . Hypertension   . Hypothyroidism, postsurgical   . Lesion of bladder   . Lower urinary tract symptoms (LUTS)   . Migraine headache   . Mild acid reflux    no meds//  but made changes with diet and eating times  . Nephrolithiasis    RIGHT SIDE  . PONV (postoperative nausea and vomiting)   . Right ureteral stone   . Seasonal allergic rhinitis   . SUI (stress urinary incontinence, female)   . Urethral caruncle   . Wears partial dentures    lower     Social History   Social History  . Marital status: Widowed    Spouse name: N/A  . Number of children: 2  . Years of education: N/A   Occupational History  . Retired Retired   Social History Main Topics  . Smoking status: Never Smoker  . Smokeless tobacco: Never Used  . Alcohol use No  . Drug use: No  . Sexual activity: No   Other Topics Concern  . Not on file   Social History Narrative   Lives with son and his wife.      Past Surgical History:  Procedure Laterality Date  . ABDOMINAL EXPLORATION SURGERY  1959   r/o  endometriosis  . ANTERIOR AND POSTERIOR REPAIR  06-03-2003  ("bladder tack")   AND CYSTO/  EXCISION URETHRAL CARUNCLE   . CATARACT EXTRACTION W/ INTRAOCULAR LENS  IMPLANT, BILATERAL    . CYSTO/  PERIURETHERAL EXCISION AND BX  11-25-2009  . CYSTOSCOPY W/ URETERAL STENT PLACEMENT Right 09/02/2015   Procedure: RIGHT URETEROSCOPY WITH RETROGRADE PYELOGRAM/ AND URETERAL STENT PLACEMENT;  Surgeon: Nickie Retort, MD;  Location: [facility];  Service: Urology;  Laterality: Right;  . CYSTOSCOPY WITH BIOPSY N/A 09/02/2015   Procedure: CYSTOSCOPY WITH BIOPSY;  Surgeon: Nickie Retort, MD;  Location[facility];  Service: Urology;  Laterality: N/A;  . EXPLORATORY LAPAROTOMY W/ BILATERAL SALPINGOOPHORECTOMY  08-21-2002  . EXTRACORPOREAL SHOCK WAVE LITHOTRIPSY  x5  1990--2010  . HOLMIUM LASER APPLICATION Right 35/46/5681   Procedure: LASER LITHOTRIPSY RIGHT;  Surgeon: Nickie Retort, MD;  Location: [facility];  Service: Urology;  Laterality: Right;  . MULTIPLE VAGINAL LESION BX'S  08-31-2001  . SHOULDER ARTHROSCOPY WITH OPEN ROTATOR CUFF REPAIR AND DISTAL CLAVICLE ACROMINECTOMY Right 06-14-2006  . THYROIDECTOMY  1959   goiter  .  VAGINAL HYSTERECTOMY  1978    Family History  Problem Relation Age of Onset  . Atrial fibrillation Sister   . Hypertension Mother   . Heart disease Sister   . Cancer Neg Hx   . Diabetes Neg Hx   . Heart attack Neg Hx   . Hyperlipidemia Neg Hx   . Sudden death Neg Hx   . Colon cancer Neg Hx     No Known Allergies  Current Outpatient Prescriptions on File Prior to Visit  Medication Sig Dispense Refill  . acetaminophen (TYLENOL) 650 MG CR tablet Take by mouth. 1-2 tablets daily PRN for pain    . aspirin 81 MG tablet Take 81 mg by mouth 3 (three) times a week.     . Biotin 5000 MCG CAPS Take 2 capsules by mouth every evening.    . Calcium Carbonate Antacid (TUMS E-X PO) Take 2 each by mouth at bedtime.    .  Calcium Carbonate-Vitamin D (CALTRATE 600+D) 600-400 MG-UNIT per tablet Take 1 tablet by mouth daily. (Patient taking differently: Take 1 tablet by mouth every morning. )    . Cholecalciferol (VITAMIN D3) 5000 UNITS CAPS Take 2 capsules by mouth every evening.    . diclofenac sodium (VOLTAREN) 1 % GEL Apply 2 g topically 3 (three) times daily as needed (for neck/shoulder pain.). Reported on 04/22/2016    . docusate sodium (COLACE) 100 MG capsule Take 1 capsule (100 mg total) by mouth 2 (two) times daily. 10 capsule 0  . escitalopram (LEXAPRO) 10 MG tablet One tablet by mouth once daily. 30 tablet 0  . hydrochlorothiazide (MICROZIDE) 12.5 MG capsule TAKE ONE CAPSULE BY MOUTH ONCE DAILY IN THE MORNING 90 capsule 1  . levothyroxine (SYNTHROID, LEVOTHROID) 50 MCG tablet Take 1 tablet (50 mcg total) by mouth daily. 90 tablet 1  . lisinopril (PRINIVIL,ZESTRIL) 2.5 MG tablet TAKE ONE TABLET BY MOUTH ONCE DAILY IN THE MORNING 90 tablet 1  . mirabegron ER (MYRBETRIQ) 25 MG TB24 tablet Take 1 tablet (25 mg total) by mouth daily. 30 tablet 0  . Multiple Vitamins-Minerals (MULTIVITAMIN WITH MINERALS) tablet Take 1 tablet by mouth every morning.      No current facility-administered medications on file prior to visit.     BP (!) 120/59 (BP Location: Left Arm, Cuff Size: Normal)   Pulse 63   Temp 98 F (36.7 C) (Oral)   Resp 16   Ht 5\' 6"  (1.676 m)   Wt 171 lb 9.6 oz (77.8 kg)   SpO2 97%   BMI 27.70 kg/m       Objective:   Physical Exam  Constitutional: She is oriented to person, place, and time. She appears well-developed and well-nourished.  HENT:  Head: Normocephalic and atraumatic.  Cardiovascular: Normal rate, regular rhythm and normal heart sounds.   No murmur heard. Pulmonary/Chest: Effort normal and breath sounds normal. No respiratory distress. She has no wheezes.  Musculoskeletal: She exhibits no edema.  Neurological: She is alert and oriented to person, place, and time.  Psychiatric:  She has a normal mood and affect. Her behavior is normal. Judgment and thought content normal.          Assessment & Plan:

## 2017-04-01 NOTE — Assessment & Plan Note (Signed)
Stable on myrbetriq. Continue same.  °

## 2017-04-01 NOTE — Patient Instructions (Signed)
Start lexapro 10mg  tabs- 1/2 tablet by mouth once daily for 2 weeks, then 1/2 tab every other day for 1 week, then stop

## 2017-04-26 ENCOUNTER — Other Ambulatory Visit: Payer: Self-pay | Admitting: Family

## 2017-06-28 ENCOUNTER — Other Ambulatory Visit: Payer: Self-pay | Admitting: Family

## 2017-07-12 ENCOUNTER — Encounter: Payer: Self-pay | Admitting: Family

## 2017-07-12 ENCOUNTER — Ambulatory Visit: Payer: Medicare Other | Admitting: Family

## 2017-07-12 ENCOUNTER — Ambulatory Visit (INDEPENDENT_AMBULATORY_CARE_PROVIDER_SITE_OTHER): Payer: Medicare Other | Admitting: Family

## 2017-07-12 ENCOUNTER — Other Ambulatory Visit: Payer: Self-pay | Admitting: Family

## 2017-07-12 VITALS — BP 134/62 | HR 61 | Temp 98.1°F | Resp 18 | Ht 66.0 in | Wt 170.6 lb

## 2017-07-12 DIAGNOSIS — I10 Essential (primary) hypertension: Secondary | ICD-10-CM

## 2017-07-12 DIAGNOSIS — K219 Gastro-esophageal reflux disease without esophagitis: Secondary | ICD-10-CM | POA: Diagnosis not present

## 2017-07-12 LAB — BASIC METABOLIC PANEL
BUN: 21 mg/dL (ref 6–23)
CO2: 31 mEq/L (ref 19–32)
Calcium: 10.5 mg/dL (ref 8.4–10.5)
Chloride: 102 mEq/L (ref 96–112)
Creatinine, Ser: 0.87 mg/dL (ref 0.40–1.20)
GFR: 66.47 mL/min (ref >60.00)
Glucose, Bld: 112 mg/dL — ABNORMAL HIGH (ref 70–99)
POTASSIUM: 4.3 meq/L (ref 3.5–5.1)
SODIUM: 141 meq/L (ref 135–145)

## 2017-07-12 MED ORDER — MIRABEGRON ER 25 MG PO TB24: 25.0000 mg | ORAL_TABLET | Freq: Every day | ORAL | 1 refills | Status: DC

## 2017-07-12 MED ORDER — OMEPRAZOLE 20 MG PO CPDR: 20.0000 mg | DELAYED_RELEASE_CAPSULE | Freq: Every day | ORAL | 5 refills | Status: DC

## 2017-07-12 MED ORDER — ESCITALOPRAM OXALATE 10 MG PO TABS: 10.0000 mg | ORAL_TABLET | Freq: Every day | ORAL | 1 refills | Status: DC

## 2017-07-12 MED ORDER — MIRABEGRON ER 25 MG PO TB24: 25.0000 mg | ORAL_TABLET | Freq: Every day | ORAL | 5 refills | Status: DC

## 2017-07-12 MED ORDER — HYDROCHLOROTHIAZIDE 12.5 MG PO CAPS: ORAL_CAPSULE | ORAL | 1 refills | Status: DC

## 2017-07-12 NOTE — Patient Instructions (Signed)
Please restart lexapro. Start omeprazole once daily for your reflux.  Complete lab work prior to leaving.

## 2017-07-12 NOTE — Progress Notes (Signed)
Subjective:    Patient ID: Mallory Hardy, female    DOB: [birth year], 82 y.o.   MRN: [id number]  HPI  Mallory Hardy is an 82 yr old female who presents today for follow up of her depression/anxiety.  She felt that her symptoms were well controlled and she had tolerated a decrease from 20 mg to 10 mg of her lexapro dose.  Last visit she desired to taper off completely and we gave her a taper plan. She reports that she has been more emotional and "cries about everything" since she came off of the lexapro.   HTN-maintained on lisinopril 2.5mg , hctz 12.5mg . Notes an occasional dry cough.   BP Readings from Last 3 Encounters:  07/12/17 134/62  04/01/17 (!) 120/59  03/04/17 118/61   Hypothyroid- maintained on synthroid.   Lab Results  Component Value Date   TSH 0.87 10/20/2016   GERD- notes + gerd sxs, "eating tums like crazy."  Review of Systems See HPI  Past Medical History:  Diagnosis Date  . Anxiety   . Arthritis    SHOULDERS  . Complication of anesthesia    SLOW TO WAKE  . Depression   . Diverticulosis   . Gross hematuria   . Hiatal hernia   . History of esophageal dilatation    FOR STRICTURE  . History of kidney stones   . History of squamous cell carcinoma excision   . Hypertension   . Hypothyroidism, postsurgical   . Lesion of bladder   . Lower urinary tract symptoms (LUTS)   . Migraine headache   . Mild acid reflux    no meds//  but made changes with diet and eating times  . Nephrolithiasis    RIGHT SIDE  . PONV (postoperative nausea and vomiting)   . Right ureteral stone   . Seasonal allergic rhinitis   . SUI (stress urinary incontinence, female)   . Urethral caruncle   . Wears partial dentures    lower     Social History   Social History  . Marital status: Widowed    Spouse name: N/A  . Number of children: 2  . Years of education: N/A   Occupational History  . Retired Retired   Social History Main Topics  . Smoking status: Never Smoker  .  Smokeless tobacco: Never Used  . Alcohol use No  . Drug use: No  . Sexual activity: No   Other Topics Concern  . Not on file   Social History Narrative   Lives with Mallory Hardy and his wife.      Past Surgical History:  Procedure Laterality Date  . ABDOMINAL EXPLORATION SURGERY  1959   r/o endometriosis  . ANTERIOR AND POSTERIOR REPAIR  06-03-2003  ("bladder tack")   AND CYSTO/  EXCISION URETHRAL CARUNCLE   . CATARACT EXTRACTION W/ INTRAOCULAR LENS  IMPLANT, BILATERAL    . CYSTO/  PERIURETHERAL EXCISION AND BX  11-25-2009  . CYSTOSCOPY W/ URETERAL STENT PLACEMENT Right 09/02/2015   Procedure: RIGHT URETEROSCOPY WITH RETROGRADE PYELOGRAM/ AND URETERAL STENT PLACEMENT;  Surgeon: Nickie Retort, MD;  Location: [facility];  Service: Urology;  Laterality: Right;  . CYSTOSCOPY WITH BIOPSY N/A 09/02/2015   Procedure: CYSTOSCOPY WITH BIOPSY;  Surgeon: Nickie Retort, MD;  Location: [facility];  Service: Urology;  Laterality: N/A;  . EXPLORATORY LAPAROTOMY W/ BILATERAL SALPINGOOPHORECTOMY  08-21-2002  . EXTRACORPOREAL SHOCK WAVE LITHOTRIPSY  x5  1990--2010  . HOLMIUM LASER APPLICATION Right 02/63/7858  Procedure: LASER LITHOTRIPSY RIGHT;  Surgeon: Nickie Retort, MD;  Location: [facility];  Service: Urology;  Laterality: Right;  . MULTIPLE VAGINAL LESION BX'S  08-31-2001  . SHOULDER ARTHROSCOPY WITH OPEN ROTATOR CUFF REPAIR AND DISTAL CLAVICLE ACROMINECTOMY Right 06-14-2006  . THYROIDECTOMY  1959   goiter  . VAGINAL HYSTERECTOMY  1978    Family History  Problem Relation Age of Onset  . Atrial fibrillation Sister   . Hypertension Mother   . Heart disease Sister   . Cancer Neg Hx   . Diabetes Neg Hx   . Heart attack Neg Hx   . Hyperlipidemia Neg Hx   . Sudden death Neg Hx   . Colon cancer Neg Hx     No Known Allergies  Current Outpatient Prescriptions on File Prior to Visit  Medication Sig Dispense Refill  . acetaminophen  (TYLENOL) 650 MG CR tablet Take by mouth. 1-2 tablets daily PRN for pain    . aspirin 81 MG tablet Take 81 mg by mouth 3 (three) times a week.     . Biotin 5000 MCG CAPS Take 2 capsules by mouth every evening.    . Calcium Carbonate Antacid (TUMS E-X PO) Take 2 each by mouth at bedtime.    . Calcium Carbonate-Vitamin D (CALTRATE 600+D) 600-400 MG-UNIT per tablet Take 1 tablet by mouth daily. (Patient taking differently: Take 1 tablet by mouth every morning. )    . Cholecalciferol (VITAMIN D3) 5000 UNITS CAPS Take 2 capsules by mouth every evening.    . diclofenac sodium (VOLTAREN) 1 % GEL Apply 2 g topically 3 (three) times daily as needed (for neck/shoulder pain.). Reported on 04/22/2016    . docusate sodium (COLACE) 100 MG capsule Take 1 capsule (100 mg total) by mouth 2 (two) times daily. 10 capsule 0  . hydrochlorothiazide (MICROZIDE) 12.5 MG capsule TAKE ONE CAPSULE BY MOUTH ONCE DAILY IN THE MORNING 90 capsule 1  . levothyroxine (SYNTHROID, LEVOTHROID) 50 MCG tablet TAKE ONE TABLET BY MOUTH ONCE DAILY 90 tablet 1  . lisinopril (PRINIVIL,ZESTRIL) 2.5 MG tablet TAKE ONE TABLET BY MOUTH IN THE MORNING 90 tablet 1  . mirabegron ER (MYRBETRIQ) 25 MG TB24 tablet Take 1 tablet (25 mg total) by mouth daily. 30 tablet 0  . Multiple Vitamins-Minerals (MULTIVITAMIN WITH MINERALS) tablet Take 1 tablet by mouth every morning.     . escitalopram (LEXAPRO) 10 MG tablet 1/2 tablet by mouth once daily for 2 weeks, then 1/2 tab every other day for 1 week, then stop (Patient not taking: Reported on 07/12/2017) 30 tablet 0   No current facility-administered medications on file prior to visit.     BP 134/62 (BP Location: Right Arm, Cuff Size: Normal)   Pulse 61   Temp 98.1 F (36.7 C) (Oral)   Resp 18   Ht 5\' 6"  (1.676 m)   Wt 170 lb 9.6 oz (77.4 kg)   SpO2 99%   BMI 27.54 kg/m       Objective:   Physical Exam  Constitutional: She is oriented to person, place, and time. She appears well-developed and  well-nourished.  Cardiovascular: Normal rate, regular rhythm and normal heart sounds.   No murmur heard. Pulmonary/Chest: Effort normal and breath sounds normal. No respiratory distress. She has no wheezes.  Musculoskeletal: She exhibits no edema.  Neurological: She is alert and oriented to person, place, and time.  Psychiatric: Her behavior is normal. Judgment and thought content normal.  tearful  Assessment & Plan:

## 2017-07-12 NOTE — Assessment & Plan Note (Signed)
Uncontrolled, add PPI.

## 2017-07-12 NOTE — Assessment & Plan Note (Signed)
Stable on current meds. Advised pt cough may be due to ACE. Will see how she does with GERD treatment.

## 2017-07-12 NOTE — Assessment & Plan Note (Signed)
Depression is uncontrolled off of lexapro. Will restart lexapro to 10mg .

## 2017-07-12 NOTE — Assessment & Plan Note (Signed)
Clinically stable on synthroid, check follow up tsh.

## 2017-07-27 ENCOUNTER — Emergency Department (HOSPITAL_BASED_OUTPATIENT_CLINIC_OR_DEPARTMENT_OTHER): Payer: Medicare Other

## 2017-07-27 ENCOUNTER — Emergency Department (HOSPITAL_BASED_OUTPATIENT_CLINIC_OR_DEPARTMENT_OTHER)
Admission: EM | Admit: 2017-07-27 | Discharge: 2017-07-27 | Disposition: A | Discharge status: Home / Self Care | Payer: Medicare Other | Attending: Emergency Medicine | Admitting: Emergency Medicine

## 2017-07-27 ENCOUNTER — Encounter (HOSPITAL_BASED_OUTPATIENT_CLINIC_OR_DEPARTMENT_OTHER): Payer: Self-pay | Admitting: Emergency Medicine

## 2017-07-27 DIAGNOSIS — Y939 Activity, unspecified: Secondary | ICD-10-CM | POA: Diagnosis not present

## 2017-07-27 DIAGNOSIS — M75102 Unspecified rotator cuff tear or rupture of left shoulder, not specified as traumatic: Secondary | ICD-10-CM | POA: Insufficient documentation

## 2017-07-27 DIAGNOSIS — Z79899 Other long term (current) drug therapy: Secondary | ICD-10-CM | POA: Diagnosis not present

## 2017-07-27 DIAGNOSIS — M25512 Pain in left shoulder: Secondary | ICD-10-CM | POA: Diagnosis not present

## 2017-07-27 DIAGNOSIS — W06XXXA Fall from bed, initial encounter: Secondary | ICD-10-CM | POA: Insufficient documentation

## 2017-07-27 DIAGNOSIS — E039 Hypothyroidism, unspecified: Secondary | ICD-10-CM | POA: Insufficient documentation

## 2017-07-27 DIAGNOSIS — Y929 Unspecified place or not applicable: Secondary | ICD-10-CM | POA: Diagnosis not present

## 2017-07-27 DIAGNOSIS — Z7982 Long term (current) use of aspirin: Secondary | ICD-10-CM | POA: Diagnosis not present

## 2017-07-27 DIAGNOSIS — Y999 Unspecified external cause status: Secondary | ICD-10-CM | POA: Diagnosis not present

## 2017-07-27 DIAGNOSIS — I10 Essential (primary) hypertension: Secondary | ICD-10-CM | POA: Insufficient documentation

## 2017-07-27 DIAGNOSIS — S4992XA Unspecified injury of left shoulder and upper arm, initial encounter: Secondary | ICD-10-CM | POA: Diagnosis not present

## 2017-07-27 MED ORDER — TRAMADOL HCL 50 MG PO TABS
50.0000 mg | ORAL_TABLET | Freq: Once | ORAL | Status: AC
  Administered 2017-07-27: 50 mg via ORAL
  Filled 2017-07-27: qty 1

## 2017-07-27 MED ORDER — TRAMADOL HCL 50 MG PO TABS: 50.0000 mg | ORAL_TABLET | Freq: Four times a day (QID) | ORAL | 0 refills | Status: DC | PRN

## 2017-07-27 MED FILL — traMADol HCL 50 MG TABS: 50 | 3 days supply | Qty: 15 | Fill #0

## 2017-07-27 NOTE — ED Provider Notes (Signed)
[facility] Provider Note   CSN: [id number] Arrival date & time: 07/27/17  0930     History   Chief Complaint Chief Complaint  Patient presents with  . Fall    HPI Mallory Hardy is a 82 y.o. female.  HPI Patient states she rolled out of bed 3 days ago landing on her left side. Complained of initial pain to the left shoulder and left knee. The left knee has improved. She is ambulating without difficulty. She continues to have left shoulder pain. She has difficulty raising shoulder over her head. She denies any neck pain. No headache. No focal weakness or numbness. Past Medical History:  Diagnosis Date  . Anxiety   . Arthritis    SHOULDERS  . Complication of anesthesia    SLOW TO WAKE  . Depression   . Diverticulosis   . Gross hematuria   . Hiatal hernia   . History of esophageal dilatation    FOR STRICTURE  . History of kidney stones   . History of squamous cell carcinoma excision   . Hypertension   . Hypothyroidism, postsurgical   . Lesion of bladder   . Lower urinary tract symptoms (LUTS)   . Migraine headache   . Mild acid reflux    no meds//  but made changes with diet and eating times  . Nephrolithiasis    RIGHT SIDE  . PONV (postoperative nausea and vomiting)   . Right ureteral stone   . Seasonal allergic rhinitis   . SUI (stress urinary incontinence, female)   . Urethral caruncle   . Wears partial dentures    lower    Patient Active Problem List   Diagnosis Date Noted  . Overactive bladder 03/04/2017  . Osteopenia 10/23/2014  . Rectal bleeding 10/24/2013  . Palpitations 07/11/2013  . Cataract 06/27/2013  . Right knee pain 07/08/2011  . Varicose veins 01/15/2011  . Neck pain, chronic 01/15/2011  . HYPERGLYCEMIA 07/16/2010  . DEPRESSION/ANXIETY 12/15/2008  . GERD 12/15/2008  . Hypothyroidism 12/11/2008  . MIGRAINE HEADACHE 12/11/2008  . Essential hypertension 12/11/2008  . ALLERGIC RHINITIS, SEASONAL 12/11/2008  .  CONSTIPATION, CHRONIC 12/11/2008  . RENAL CALCULUS 12/11/2008    Past Surgical History:  Procedure Laterality Date  . ABDOMINAL EXPLORATION SURGERY  1959   r/o endometriosis  . ANTERIOR AND POSTERIOR REPAIR  06-03-2003  ("bladder tack")   AND CYSTO/  EXCISION URETHRAL CARUNCLE   . CATARACT EXTRACTION W/ INTRAOCULAR LENS  IMPLANT, BILATERAL    . CYSTO/  PERIURETHERAL EXCISION AND BX  11-25-2009  . CYSTOSCOPY W/ URETERAL STENT PLACEMENT Right 09/02/2015   Procedure: RIGHT URETEROSCOPY WITH RETROGRADE PYELOGRAM/ AND URETERAL STENT PLACEMENT;  Surgeon: Nickie Retort, MD;  Location: [facility];  Service: Urology;  Laterality: Right;  . CYSTOSCOPY WITH BIOPSY N/A 09/02/2015   Procedure: CYSTOSCOPY WITH BIOPSY;  Surgeon: Nickie Retort, MD;  Location: [facility];  Service: Urology;  Laterality: N/A;  . EXPLORATORY LAPAROTOMY W/ BILATERAL SALPINGOOPHORECTOMY  08-21-2002  . EXTRACORPOREAL SHOCK WAVE LITHOTRIPSY  x5  1990--2010  . HOLMIUM LASER APPLICATION Right 23/76/2831   Procedure: LASER LITHOTRIPSY RIGHT;  Surgeon: Nickie Retort, MD;  Location: [facility];  Service: Urology;  Laterality: Right;  . MULTIPLE VAGINAL LESION BX'S  08-31-2001  . SHOULDER ARTHROSCOPY WITH OPEN ROTATOR CUFF REPAIR AND DISTAL CLAVICLE ACROMINECTOMY Right 06-14-2006  . THYROIDECTOMY  1959   goiter  . VAGINAL HYSTERECTOMY  1978    OB History  No data available       Home Medications    Prior to Admission medications   Medication Sig Start Date End Date Taking? Authorizing Provider  acetaminophen (TYLENOL) 650 MG CR tablet Take by mouth. 1-2 tablets daily PRN for pain    [provider]  aspirin 81 MG tablet Take 81 mg by mouth 3 (three) times a week.     [provider]  Biotin 5000 MCG CAPS Take 2 capsules by mouth every evening.    [provider]  Calcium Carbonate Antacid (TUMS E-X PO) Take 2 each by mouth at  bedtime.    [provider]  Calcium Carbonate-Vitamin D (CALTRATE 600+D) 600-400 MG-UNIT per tablet Take 1 tablet by mouth daily. Patient taking differently: Take 1 tablet by mouth every morning.  11/01/14   Debbrah Alar, NP  Cholecalciferol (VITAMIN D3) 5000 UNITS CAPS Take 2 capsules by mouth every evening.    [provider]  diclofenac sodium (VOLTAREN) 1 % GEL Apply 2 g topically 3 (three) times daily as needed (for neck/shoulder pain.). Reported on 04/22/2016    [provider]  docusate sodium (COLACE) 100 MG capsule Take 1 capsule (100 mg total) by mouth 2 (two) times daily. 09/02/15   Nickie Retort, MD  escitalopram (LEXAPRO) 10 MG tablet Take 1 tablet (10 mg total) by mouth daily. 07/12/17   Debbrah Alar, NP  hydrochlorothiazide (MICROZIDE) 12.5 MG capsule TAKE ONE CAPSULE BY MOUTH ONCE DAILY IN THE MORNING 07/12/17   Debbrah Alar, NP  levothyroxine (SYNTHROID, LEVOTHROID) 50 MCG tablet TAKE ONE TABLET BY MOUTH ONCE DAILY 06/28/17   Debbrah Alar, NP  lisinopril (PRINIVIL,ZESTRIL) 2.5 MG tablet TAKE ONE TABLET BY MOUTH IN THE MORNING 04/27/17   Debbrah Alar, NP  mirabegron ER (MYRBETRIQ) 25 MG TB24 tablet Take 1 tablet (25 mg total) by mouth daily. 07/12/17   Debbrah Alar, NP  Multiple Vitamins-Minerals (MULTIVITAMIN WITH MINERALS) tablet Take 1 tablet by mouth every morning.     [provider]  omeprazole (PRILOSEC) 20 MG capsule Take 1 capsule (20 mg total) by mouth daily. 07/12/17   Debbrah Alar, NP  traMADol (ULTRAM) 50 MG tablet Take 1 tablet (50 mg total) by mouth every 6 (six) hours as needed for severe pain. 07/27/17   Julianne Rice, MD    Family History Family History  Problem Relation Age of Onset  . Atrial fibrillation Sister   . Hypertension Mother   . Heart disease Sister   . Cancer Neg Hx   . Diabetes Neg Hx   . Heart attack Neg Hx   . Hyperlipidemia Neg Hx   . Sudden death Neg Hx     . Colon cancer Neg Hx     Social History Social History  Substance Use Topics  . Smoking status: Never Smoker  . Smokeless tobacco: Never Used  . Alcohol use No     Allergies   Patient has no known allergies.   Review of Systems Review of Systems  HENT: Negative for facial swelling.   Respiratory: Negative for shortness of breath.   Cardiovascular: Negative for chest pain.  Gastrointestinal: Negative for abdominal pain, nausea and vomiting.  Musculoskeletal: Positive for arthralgias, joint swelling and myalgias. Negative for back pain, gait problem and neck pain.  Skin: Positive for wound. Negative for rash.  Neurological: Negative for dizziness, weakness, light-headedness, numbness and headaches.  All other systems reviewed and are negative.    Physical Exam Updated Vital Signs BP Marland Kitchen)  154/76 (BP Location: Right Arm)   Pulse 83   Temp 98.4 F (36.9 C) (Oral)   Resp 18   Ht 5\' 7"  (1.702 m)   Wt 77.1 kg (170 lb)   SpO2 97%   BMI 26.63 kg/m   Physical Exam  Constitutional: She is oriented to person, place, and time. She appears well-developed and well-nourished. No distress.  HENT:  Head: Normocephalic and atraumatic.  Mouth/Throat: Oropharynx is clear and moist. No oropharyngeal exudate.  Very minimal left sided zygomatic arch tenderness. No obvious facial swelling or contusion. No malocclusion.  Eyes: Pupils are equal, round, and reactive to light. EOM are normal.  No entrapment.  Neck: Normal range of motion. Neck supple.  No posterior midline cervical tenderness to palpation.  Cardiovascular: Normal rate and regular rhythm.   Pulmonary/Chest: Effort normal and breath sounds normal. No respiratory distress. She has no wheezes. She has no rales. She exhibits no tenderness.  Abdominal: Soft. Bowel sounds are normal. There is no tenderness. There is no rebound and no guarding.  Musculoskeletal: Normal range of motion. She exhibits no edema or tenderness.  Pelvis  is stable. Patient has tenderness to palpation along the superior and lateral left deltoid. She has pain with abduction of the left shoulder. No obvious deformity. There is a contusion overlying the lateral deltoid.. Patient has full range of motion of the left wrist and elbow without pain. Distal pulses are 2+. Patient is full range of motion of the left knee without pain. No ligamentous instability. Healing left sided lateral knee contusion.  Neurological: She is alert and oriented to person, place, and time.  Ambulates without difficulty. 5/5 grip strength bilaterally. Sensation fully intact.  Skin: Skin is warm and dry. No rash noted. No erythema.  Psychiatric: She has a normal mood and affect. Her behavior is normal.  Nursing note and vitals reviewed.    ED Treatments / Results  Labs (all labs ordered are listed, but only abnormal results are displayed) Labs Reviewed - No data to display  EKG  EKG Interpretation None       Radiology Dg Shoulder Left  Result Date: 07/27/2017 CLINICAL DATA:  Fall getting out of bed with left shoulder pain at the humeral head. Initial encounter. EXAM: LEFT SHOULDER - 2+ VIEW COMPARISON:  None. FINDINGS: Negative for acute fracture or dislocation. High humeral head with subacromial narrowing. Mild spurring of the acromion and at the [facility] joint. No acute finding in the visualized left chest. IMPRESSION: 1. No acute finding. 2. High humeral head and subacromial narrowing as seen with chronic rotator cuff tear. Electronically Signed   By: Monte Fantasia M.D.   On: 07/27/2017 10:22    Procedures Procedures (including critical care time)  Medications Ordered in ED Medications  traMADol (ULTRAM) tablet 50 mg (50 mg Oral Given 07/27/17 1103)     Initial Impression / Assessment and Plan / ED Course  I have reviewed the triage vital signs and the nursing notes.  Pertinent labs & imaging results that were available during my care of the patient were  reviewed by me and considered in my medical decision making (see chart for details).     Evidence of rotator cuff injury on x-ray. No fracture noted. Placed in sling and will give follow-up with sports medicine. Advised to frequently range shoulder to avoid frozen joint. Treat with RICE therapy.  Final Clinical Impressions(s) / ED Diagnoses   Final diagnoses:  Tear of left rotator cuff, unspecified tear extent  New Prescriptions New Prescriptions   TRAMADOL (ULTRAM) 50 MG TABLET    Take 1 tablet (50 mg total) by mouth every 6 (six) hours as needed for severe pain.     Julianne Rice, MD 07/27/17 1105

## 2017-07-27 NOTE — ED Triage Notes (Signed)
Pt rolled over to turn off alarm clock and fell out of bed on [place]. Pt hit side of face on night stand and landed on L arm. C/o L shoulder pain, denies LOC. Bruise noted to L cheek.

## 2017-07-29 ENCOUNTER — Ambulatory Visit (INDEPENDENT_AMBULATORY_CARE_PROVIDER_SITE_OTHER): Payer: Medicare Other | Admitting: Family Medicine

## 2017-07-29 ENCOUNTER — Encounter: Payer: Self-pay | Admitting: Family Medicine

## 2017-07-29 DIAGNOSIS — S4992XA Unspecified injury of left shoulder and upper arm, initial encounter: Secondary | ICD-10-CM

## 2017-07-29 DIAGNOSIS — M25512 Pain in left shoulder: Secondary | ICD-10-CM

## 2017-07-29 NOTE — Patient Instructions (Signed)
I'm worried you completely tore two of your rotator cuff muscles. Use the sling if needed. Try to do arm circles, swings, table slides twice a day to help regain some of the motion and prevent additional stiffness. Tylenol, ibuprofen, and tramadol if needed for pain. We will go ahead with an MRI to further assess - I will contact you with the results and next steps.

## 2017-07-30 DIAGNOSIS — S4992XA Unspecified injury of left shoulder and upper arm, initial encounter: Secondary | ICD-10-CM | POA: Insufficient documentation

## 2017-07-30 NOTE — Assessment & Plan Note (Signed)
independently reviewed radiographs and no fractures but humerus is high riding.  This along with exam, mechanism of injury consistent with supraspinatus and infraspinatus tears.  Will go ahead with MRI.  Sling if needed.  Tylenol, ibuprofen, tramadol if needed.  Discussed surgical intervention.  Shown home motion exercises to do daily.

## 2017-07-30 NOTE — Progress Notes (Addendum)
PCP: Debbrah Alar, NP  Subjective:   HPI: Patient is a 82 y.o. female here for left shoulder injury.  Patient reports on 10/8 she accidentally fell out of bed and landed directly onto her left shoulder. Unable to move her left arm after this and still motion is very limited. Pain level up to 6/10 and sharp. Tried tylenol, tramadol, ibuprofen. She has history of rotator cuff surgery but on the right shoulder. She is right handed. Radiographs done in ED. Bruising but no other skin changes. No numbness.  Past Medical History:  Diagnosis Date  . Anxiety   . Arthritis    SHOULDERS  . Complication of anesthesia    SLOW TO WAKE  . Depression   . Diverticulosis   . Gross hematuria   . Hiatal hernia   . History of esophageal dilatation    FOR STRICTURE  . History of kidney stones   . History of squamous cell carcinoma excision   . Hypertension   . Hypothyroidism, postsurgical   . Lesion of bladder   . Lower urinary tract symptoms (LUTS)   . Migraine headache   . Mild acid reflux    no meds//  but made changes with diet and eating times  . Nephrolithiasis    RIGHT SIDE  . PONV (postoperative nausea and vomiting)   . Right ureteral stone   . Seasonal allergic rhinitis   . SUI (stress urinary incontinence, female)   . Urethral caruncle   . Wears partial dentures    lower    Current Outpatient Prescriptions on File Prior to Visit  Medication Sig Dispense Refill  . acetaminophen (TYLENOL) 650 MG CR tablet Take by mouth. 1-2 tablets daily PRN for pain    . aspirin 81 MG tablet Take 81 mg by mouth 3 (three) times a week.     . Biotin 5000 MCG CAPS Take 2 capsules by mouth every evening.    . Calcium Carbonate Antacid (TUMS E-X PO) Take 2 each by mouth at bedtime.    . Calcium Carbonate-Vitamin D (CALTRATE 600+D) 600-400 MG-UNIT per tablet Take 1 tablet by mouth daily. (Patient taking differently: Take 1 tablet by mouth every morning. )    . Cholecalciferol (VITAMIN D3)  5000 UNITS CAPS Take 2 capsules by mouth every evening.    . diclofenac sodium (VOLTAREN) 1 % GEL Apply 2 g topically 3 (three) times daily as needed (for neck/shoulder pain.). Reported on 04/22/2016    . docusate sodium (COLACE) 100 MG capsule Take 1 capsule (100 mg total) by mouth 2 (two) times daily. 10 capsule 0  . escitalopram (LEXAPRO) 10 MG tablet Take 1 tablet (10 mg total) by mouth daily. 90 tablet 1  . hydrochlorothiazide (MICROZIDE) 12.5 MG capsule TAKE ONE CAPSULE BY MOUTH ONCE DAILY IN THE MORNING 90 capsule 1  . levothyroxine (SYNTHROID, LEVOTHROID) 50 MCG tablet TAKE ONE TABLET BY MOUTH ONCE DAILY 90 tablet 1  . lisinopril (PRINIVIL,ZESTRIL) 2.5 MG tablet TAKE ONE TABLET BY MOUTH IN THE MORNING 90 tablet 1  . mirabegron ER (MYRBETRIQ) 25 MG TB24 tablet Take 1 tablet (25 mg total) by mouth daily. 30 tablet 5  . Multiple Vitamins-Minerals (MULTIVITAMIN WITH MINERALS) tablet Take 1 tablet by mouth every morning.     Marland Kitchen omeprazole (PRILOSEC) 20 MG capsule Take 1 capsule (20 mg total) by mouth daily. 30 capsule 5  . traMADol (ULTRAM) 50 MG tablet Take 1 tablet (50 mg total) by mouth every 6 (six) hours as needed for  severe pain. 15 tablet 0   No current facility-administered medications on file prior to visit.     Past Surgical History:  Procedure Laterality Date  . ABDOMINAL EXPLORATION SURGERY  1959   r/o endometriosis  . ANTERIOR AND POSTERIOR REPAIR  06-03-2003  ("bladder tack")   AND CYSTO/  EXCISION URETHRAL CARUNCLE   . CATARACT EXTRACTION W/ INTRAOCULAR LENS  IMPLANT, BILATERAL    . CYSTO/  PERIURETHERAL EXCISION AND BX  11-25-2009  . CYSTOSCOPY W/ URETERAL STENT PLACEMENT Right 09/02/2015   Procedure: RIGHT URETEROSCOPY WITH RETROGRADE PYELOGRAM/ AND URETERAL STENT PLACEMENT;  Surgeon: Nickie Retort, MD;  Location: [facility];  Service: Urology;  Laterality: Right;  . CYSTOSCOPY WITH BIOPSY N/A 09/02/2015   Procedure: CYSTOSCOPY WITH BIOPSY;  Surgeon:  Nickie Retort, MD;  Location: [facility];  Service: Urology;  Laterality: N/A;  . EXPLORATORY LAPAROTOMY W/ BILATERAL SALPINGOOPHORECTOMY  08-21-2002  . EXTRACORPOREAL SHOCK WAVE LITHOTRIPSY  x5  1990--2010  . HOLMIUM LASER APPLICATION Right 67/20/9470   Procedure: LASER LITHOTRIPSY RIGHT;  Surgeon: Nickie Retort, MD;  Location: [facility];  Service: Urology;  Laterality: Right;  . MULTIPLE VAGINAL LESION BX'S  08-31-2001  . SHOULDER ARTHROSCOPY WITH OPEN ROTATOR CUFF REPAIR AND DISTAL CLAVICLE ACROMINECTOMY Right 06-14-2006  . THYROIDECTOMY  1959   goiter  . VAGINAL HYSTERECTOMY  1978    No Known Allergies  Social History   Social History  . Marital status: Widowed    Spouse name: N/A  . Number of children: 2  . Years of education: N/A   Occupational History  . Retired Retired   Social History Main Topics  . Smoking status: Never Smoker  . Smokeless tobacco: Never Used  . Alcohol use No  . Drug use: No  . Sexual activity: No   Other Topics Concern  . Not on file   Social History Narrative   Lives with son and his wife.      Family History  Problem Relation Age of Onset  . Atrial fibrillation Sister   . Hypertension Mother   . Heart disease Sister   . Cancer Neg Hx   . Diabetes Neg Hx   . Heart attack Neg Hx   . Hyperlipidemia Neg Hx   . Sudden death Neg Hx   . Colon cancer Neg Hx     BP 121/79   Pulse 78   Ht 5\' 6"  (1.676 m)   Wt 170 lb (77.1 kg)   BMI 27.44 kg/m   Review of Systems: See HPI above.     Objective:  Physical Exam:  Gen: NAD, comfortable in exam room  Left shoulder: No swelling, ecchymoses.  No gross deformity. No TTP AC joint, biceps tendon. Full IR but only 10 degrees ER.  Abduction and flexion to 30 degrees. Negative Yergasons. Strength 5/5 IR.  No strength ER. NV intact distally.  Right shoulder: No swelling, ecchymoses.  No gross deformity. No TTP. FROM. Strength 5/5 with empty  can and resisted internal/external rotation. NV intact distally.   Assessment & Plan:  1. Left shoulder injury - independently reviewed radiographs and no fractures but humerus is high riding.  This along with exam, mechanism of injury consistent with supraspinatus and infraspinatus tears.  Will go ahead with MRI.  Sling if needed.  Tylenol, ibuprofen, tramadol if needed.  Discussed surgical intervention.  Shown home motion exercises to do daily.  Addendum:  MRI reviewed and discussed with patient.  Unfortunately she has full thickness tears of infraspinatus and supraspinatus as expected but with severe muscle atrophy and retraction.  She also has tear of portion of subscapularis and medial dislocation of biceps tendon but major concern is supraspinatus/infraspinatus.  I advised her I do not think these are reparable tears given amount of atrophy.  We discussed options and she would like to consult with surgeon to get their opinion as well before considering physical therapy to regain as much motion, strength as possible.  Referral placed.

## 2017-08-01 NOTE — Addendum Note (Signed)
Addended by: Sherrie George F on: 08/01/2017 09:34 AM   Modules accepted: Orders

## 2017-08-04 DIAGNOSIS — M7522 Bicipital tendinitis, left shoulder: Secondary | ICD-10-CM | POA: Diagnosis not present

## 2017-08-04 DIAGNOSIS — M24112 Other articular cartilage disorders, left shoulder: Secondary | ICD-10-CM | POA: Diagnosis not present

## 2017-08-04 DIAGNOSIS — M25412 Effusion, left shoulder: Secondary | ICD-10-CM | POA: Diagnosis not present

## 2017-08-04 DIAGNOSIS — M7582 Other shoulder lesions, left shoulder: Secondary | ICD-10-CM | POA: Diagnosis not present

## 2017-08-04 DIAGNOSIS — M19012 Primary osteoarthritis, left shoulder: Secondary | ICD-10-CM | POA: Diagnosis not present

## 2017-08-09 ENCOUNTER — Encounter: Payer: Self-pay | Admitting: Family Medicine

## 2017-08-09 NOTE — Addendum Note (Signed)
Addended by: Sherrie George F on: 08/09/2017 08:59 AM   Modules accepted: Orders

## 2017-08-10 DIAGNOSIS — M75112 Incomplete rotator cuff tear or rupture of left shoulder, not specified as traumatic: Secondary | ICD-10-CM | POA: Diagnosis not present

## 2017-08-23 ENCOUNTER — Encounter: Payer: Self-pay | Admitting: Family

## 2017-08-23 ENCOUNTER — Ambulatory Visit: Payer: Medicare Other | Admitting: Family

## 2017-08-23 VITALS — BP 134/60 | HR 72 | Temp 98.4°F | Resp 18 | Ht 66.0 in | Wt 171.4 lb

## 2017-08-23 DIAGNOSIS — K219 Gastro-esophageal reflux disease without esophagitis: Secondary | ICD-10-CM

## 2017-08-23 DIAGNOSIS — F341 Dysthymic disorder: Secondary | ICD-10-CM | POA: Diagnosis not present

## 2017-08-23 DIAGNOSIS — E039 Hypothyroidism, unspecified: Secondary | ICD-10-CM

## 2017-08-23 LAB — TSH: TSH: 0.69 u[IU]/mL (ref 0.35–4.50)

## 2017-08-23 MED ORDER — OMEPRAZOLE 20 MG PO CPDR: 20.0000 mg | DELAYED_RELEASE_CAPSULE | Freq: Every day | ORAL | 1 refills | Status: AC

## 2017-08-23 MED ORDER — HYDROCHLOROTHIAZIDE 12.5 MG PO CAPS: ORAL_CAPSULE | ORAL | 1 refills | Status: DC

## 2017-08-23 NOTE — Assessment & Plan Note (Signed)
Cough resolved with PPI, continue same.

## 2017-08-23 NOTE — Assessment & Plan Note (Signed)
Clinically stable on synthroid, continue same, obtain follow up tsh.

## 2017-08-23 NOTE — Patient Instructions (Signed)
Please complete lab work prior to leaving.   

## 2017-08-23 NOTE — Progress Notes (Signed)
Subjective:    Patient ID: Mallory Hardy, female    DOB: [birth year], 82 y.o.   MRN: [id number]  HPI  Mallory Hardy is an 82 yr old female who presents today for follow up.  1) Depression/anxiety- much better on 10mg  lexapro, sleeping well.   2) HTN- maintained on lisinopril 2.5mg  and hctz 12.5mg .  BP Readings from Last 3 Encounters:  08/23/17 134/60  07/29/17 121/79  07/27/17 (!) 154/76   3) GERD- reports resolution of cough since we started PPI, denies gerd symptoms.   4) Fall- reports fall out of bed 07/18/17.  Hurt left shoulder.   Saw Dr. Barbaraann Barthel who referred her to Dr. Mardelle Matte.  Was told that she needs a shoulder replacement.  She is continuing exercises. Not ready to schedule surgery.    Review of Systems    see HPI  Past Medical History:  Diagnosis Date  . Anxiety   . Arthritis    SHOULDERS  . Complication of anesthesia    SLOW TO WAKE  . Depression   . Diverticulosis   . Gross hematuria   . Hiatal hernia   . History of esophageal dilatation    FOR STRICTURE  . History of kidney stones   . History of squamous cell carcinoma excision   . Hypertension   . Hypothyroidism, postsurgical   . Lesion of bladder   . Lower urinary tract symptoms (LUTS)   . Migraine headache   . Mild acid reflux    no meds//  but made changes with diet and eating times  . Nephrolithiasis    RIGHT SIDE  . PONV (postoperative nausea and vomiting)   . Right ureteral stone   . Seasonal allergic rhinitis   . SUI (stress urinary incontinence, female)   . Urethral caruncle   . Wears partial dentures    lower     Social History   Socioeconomic History  . Marital status: Widowed    Spouse name: Not on file  . Number of children: 2  . Years of education: Not on file  . Highest education level: Not on file  Social Needs  . Financial resource strain: Not on file  . Food insecurity - worry: Not on file  . Food insecurity - inability: Not on file  . Transportation needs -  medical: Not on file  . Transportation needs - non-medical: Not on file  Occupational History  . Occupation: Retired    Fish farm manager: RETIRED  Tobacco Use  . Smoking status: Never Smoker  . Smokeless tobacco: Never Used  Substance and Sexual Activity  . Alcohol use: No    Alcohol/week: 0.0 oz  . Drug use: No  . Sexual activity: No  Other Topics Concern  . Not on file  Social History Narrative   Lives with son and his wife.      Past Surgical History:  Procedure Laterality Date  . ABDOMINAL EXPLORATION SURGERY  1959   r/o endometriosis  . ANTERIOR AND POSTERIOR REPAIR  06-03-2003  ("bladder tack")   AND CYSTO/  EXCISION URETHRAL CARUNCLE   . CATARACT EXTRACTION W/ INTRAOCULAR LENS  IMPLANT, BILATERAL    . CYSTO/  PERIURETHERAL EXCISION AND BX  11-25-2009  . EXPLORATORY LAPAROTOMY W/ BILATERAL SALPINGOOPHORECTOMY  08-21-2002  . EXTRACORPOREAL SHOCK WAVE LITHOTRIPSY  x5  1990--2010  . MULTIPLE VAGINAL LESION BX'S  08-31-2001  . SHOULDER ARTHROSCOPY WITH OPEN ROTATOR CUFF REPAIR AND DISTAL CLAVICLE ACROMINECTOMY Right 06-14-2006  . THYROIDECTOMY  1959   goiter  .  VAGINAL HYSTERECTOMY  1978    Family History  Problem Relation Age of Onset  . Atrial fibrillation Sister   . Hypertension Mother   . Heart disease Sister   . Cancer Neg Hx   . Diabetes Neg Hx   . Heart attack Neg Hx   . Hyperlipidemia Neg Hx   . Sudden death Neg Hx   . Colon cancer Neg Hx     No Known Allergies  Current Outpatient Medications on File Prior to Visit  Medication Sig Dispense Refill  . acetaminophen (TYLENOL) 650 MG CR tablet Take by mouth. 1-2 tablets daily PRN for pain    . aspirin 81 MG tablet Take 81 mg by mouth 3 (three) times a week.     . Biotin 5000 MCG CAPS Take 2 capsules by mouth every evening.    . Calcium Carbonate Antacid (TUMS E-X PO) Take 2 each by mouth at bedtime.    . Calcium Carbonate-Vitamin D (CALTRATE 600+D) 600-400 MG-UNIT per tablet Take 1 tablet by mouth daily.  (Patient taking differently: Take 1 tablet by mouth every morning. )    . Cholecalciferol (VITAMIN D3) 5000 UNITS CAPS Take 2 capsules by mouth every evening.    . diclofenac sodium (VOLTAREN) 1 % GEL Apply 2 g topically 3 (three) times daily as needed (for neck/shoulder pain.). Reported on 04/22/2016    . docusate sodium (COLACE) 100 MG capsule Take 1 capsule (100 mg total) by mouth 2 (two) times daily. 10 capsule 0  . escitalopram (LEXAPRO) 10 MG tablet Take 1 tablet (10 mg total) by mouth daily. 90 tablet 1  . hydrochlorothiazide (MICROZIDE) 12.5 MG capsule TAKE ONE CAPSULE BY MOUTH ONCE DAILY IN THE MORNING 90 capsule 1  . levothyroxine (SYNTHROID, LEVOTHROID) 50 MCG tablet TAKE ONE TABLET BY MOUTH ONCE DAILY 90 tablet 1  . lisinopril (PRINIVIL,ZESTRIL) 2.5 MG tablet TAKE ONE TABLET BY MOUTH IN THE MORNING 90 tablet 1  . mirabegron ER (MYRBETRIQ) 25 MG TB24 tablet Take 1 tablet (25 mg total) by mouth daily. 30 tablet 5  . Multiple Vitamins-Minerals (MULTIVITAMIN WITH MINERALS) tablet Take 1 tablet by mouth every morning.      No current facility-administered medications on file prior to visit.     BP 134/60 (BP Location: Left Arm, Cuff Size: Large)   Pulse 72   Temp 98.4 F (36.9 C) (Oral)   Resp 18   Ht 5\' 6"  (1.676 m)   Wt 171 lb 6.4 oz (77.7 kg)   SpO2 98%   BMI 27.66 kg/m    Objective:   Physical Exam  Constitutional: She is oriented to person, place, and time. She appears well-developed and well-nourished.  HENT:  Head: Normocephalic.  Cardiovascular: Normal rate, regular rhythm and normal heart sounds.  No murmur heard. Pulmonary/Chest: Effort normal and breath sounds normal. No respiratory distress. She has no wheezes.  Musculoskeletal: She exhibits no edema.  Neurological: She is alert and oriented to person, place, and time.  Psychiatric: She has a normal mood and affect. Her behavior is normal. Judgment and thought content normal.          Assessment & Plan:

## 2017-08-23 NOTE — Assessment & Plan Note (Signed)
Stable/improved on lexapro, continue same.

## 2017-09-13 ENCOUNTER — Encounter: Payer: Self-pay | Admitting: Family

## 2017-09-13 ENCOUNTER — Ambulatory Visit: Payer: Medicare Other | Admitting: Family

## 2017-09-13 ENCOUNTER — Ambulatory Visit (HOSPITAL_BASED_OUTPATIENT_CLINIC_OR_DEPARTMENT_OTHER)
Admission: RE | Admit: 2017-09-13 | Discharge: 2017-09-13 | Disposition: A | Discharge status: Home / Self Care | Payer: Medicare Other | Source: Ambulatory Visit | Attending: Family | Admitting: Family

## 2017-09-13 VITALS — BP 138/73 | HR 92 | Temp 98.2°F | Resp 16 | Ht 66.0 in | Wt 169.4 lb

## 2017-09-13 DIAGNOSIS — R05 Cough: Secondary | ICD-10-CM

## 2017-09-13 DIAGNOSIS — B9789 Other viral agents as the cause of diseases classified elsewhere: Secondary | ICD-10-CM

## 2017-09-13 DIAGNOSIS — J069 Acute upper respiratory infection, unspecified: Secondary | ICD-10-CM | POA: Diagnosis not present

## 2017-09-13 DIAGNOSIS — R059 Cough, unspecified: Secondary | ICD-10-CM

## 2017-09-13 MED ORDER — BENZONATATE 100 MG PO CAPS: 100.0000 mg | ORAL_CAPSULE | Freq: Three times a day (TID) | ORAL | 0 refills | Status: AC | PRN

## 2017-09-13 NOTE — Progress Notes (Signed)
Subjective:    Patient ID: Mallory Hardy, female    DOB: 1935-09-11, 82 y.o.   MRN: [id number]  HPI   Mallory Hardy is an 82 yr old female who presents today with chief complaint of dry cough. Reports that cough began on 11/23.  Initially cough was dry but became productive yesterday.  Using mucinex.  Mild "scratchy throat."  Denies nasal congestion or sick contacts.      Review of Systems See HPI  Past Medical History:  Diagnosis Date  . Anxiety   . Arthritis    SHOULDERS  . Complication of anesthesia    SLOW TO WAKE  . Depression   . Diverticulosis   . Gross hematuria   . Hiatal hernia   . History of esophageal dilatation    FOR STRICTURE  . History of kidney stones   . History of squamous cell carcinoma excision   . Hypertension   . Hypothyroidism, postsurgical   . Lesion of bladder   . Lower urinary tract symptoms (LUTS)   . Migraine headache   . Mild acid reflux    no meds//  but made changes with diet and eating times  . Nephrolithiasis    RIGHT SIDE  . PONV (postoperative nausea and vomiting)   . Right ureteral stone   . Seasonal allergic rhinitis   . SUI (stress urinary incontinence, female)   . Urethral caruncle   . Wears partial dentures    lower     Social History   Socioeconomic History  . Marital status: Widowed    Spouse name: Not on file  . Number of children: 2  . Years of education: Not on file  . Highest education level: Not on file  Social Needs  . Financial resource strain: Not on file  . Food insecurity - worry: Not on file  . Food insecurity - inability: Not on file  . Transportation needs - medical: Not on file  . Transportation needs - non-medical: Not on file  Occupational History  . Occupation: Retired    Fish farm manager: RETIRED  Tobacco Use  . Smoking status: Never Smoker  . Smokeless tobacco: Never Used  Substance and Sexual Activity  . Alcohol use: No    Alcohol/week: 0.0 oz  . Drug use: No  . Sexual activity: No  Other  Topics Concern  . Not on file  Social History Narrative   Lives with son and his wife.      Past Surgical History:  Procedure Laterality Date  . ABDOMINAL EXPLORATION SURGERY  1959   r/o endometriosis  . ANTERIOR AND POSTERIOR REPAIR  06-03-2003  ("bladder tack")   AND CYSTO/  EXCISION URETHRAL CARUNCLE   . CATARACT EXTRACTION W/ INTRAOCULAR LENS  IMPLANT, BILATERAL    . CYSTO/  PERIURETHERAL EXCISION AND BX  11-25-2009  . CYSTOSCOPY W/ URETERAL STENT PLACEMENT Right 09/02/2015   Procedure: RIGHT URETEROSCOPY WITH RETROGRADE PYELOGRAM/ AND URETERAL STENT PLACEMENT;  Surgeon: Nickie Retort, MD;  Location: [facility];  Service: Urology;  Laterality: Right;  . CYSTOSCOPY WITH BIOPSY N/A 09/02/2015   Procedure: CYSTOSCOPY WITH BIOPSY;  Surgeon: Nickie Retort, MD;  Location: [facility];  Service: Urology;  Laterality: N/A;  . EXPLORATORY LAPAROTOMY W/ BILATERAL SALPINGOOPHORECTOMY  08-21-2002  . EXTRACORPOREAL SHOCK WAVE LITHOTRIPSY  x5  1990--2010  . HOLMIUM LASER APPLICATION Right 53/61/4431   Procedure: LASER LITHOTRIPSY RIGHT;  Surgeon: Nickie Retort, MD;  Location: [facility];  Service: Urology;  Laterality: Right;  . MULTIPLE VAGINAL LESION BX'S  08-31-2001  . SHOULDER ARTHROSCOPY WITH OPEN ROTATOR CUFF REPAIR AND DISTAL CLAVICLE ACROMINECTOMY Right 06-14-2006  . THYROIDECTOMY  1959   goiter  . VAGINAL HYSTERECTOMY  1978    Family History  Problem Relation Age of Onset  . Atrial fibrillation Sister   . Hypertension Mother   . Heart disease Sister   . Cancer Neg Hx   . Diabetes Neg Hx   . Heart attack Neg Hx   . Hyperlipidemia Neg Hx   . Sudden death Neg Hx   . Colon cancer Neg Hx     No Known Allergies  Current Outpatient Medications on File Prior to Visit  Medication Sig Dispense Refill  . acetaminophen (TYLENOL) 650 MG CR tablet Take by mouth. 1-2 tablets daily PRN for pain    . aspirin 81 MG tablet Take  81 mg by mouth 3 (three) times a week.     . Biotin 5000 MCG CAPS Take 2 capsules by mouth every evening.    . Calcium Carbonate Antacid (TUMS E-X PO) Take 2 each by mouth at bedtime.    . Calcium Carbonate-Vitamin D (CALTRATE 600+D) 600-400 MG-UNIT per tablet Take 1 tablet by mouth daily. (Patient taking differently: Take 1 tablet by mouth every morning. )    . Cholecalciferol (VITAMIN D3) 5000 UNITS CAPS Take 2 capsules by mouth every evening.    . diclofenac sodium (VOLTAREN) 1 % GEL Apply 2 g topically 3 (three) times daily as needed (for neck/shoulder pain.). Reported on 04/22/2016    . docusate sodium (COLACE) 100 MG capsule Take 1 capsule (100 mg total) by mouth 2 (two) times daily. 10 capsule 0  . escitalopram (LEXAPRO) 10 MG tablet Take 1 tablet (10 mg total) by mouth daily. 90 tablet 1  . hydrochlorothiazide (MICROZIDE) 12.5 MG capsule TAKE ONE CAPSULE BY MOUTH ONCE DAILY IN THE MORNING 90 capsule 1  . levothyroxine (SYNTHROID, LEVOTHROID) 50 MCG tablet TAKE ONE TABLET BY MOUTH ONCE DAILY 90 tablet 1  . lisinopril (PRINIVIL,ZESTRIL) 2.5 MG tablet TAKE ONE TABLET BY MOUTH IN THE MORNING 90 tablet 1  . mirabegron ER (MYRBETRIQ) 25 MG TB24 tablet Take 1 tablet (25 mg total) by mouth daily. 30 tablet 5  . Multiple Vitamins-Minerals (MULTIVITAMIN WITH MINERALS) tablet Take 1 tablet by mouth every morning.     Marland Kitchen omeprazole (PRILOSEC) 20 MG capsule Take 1 capsule (20 mg total) daily by mouth. 90 capsule 1   No current facility-administered medications on file prior to visit.     BP 138/73 (BP Location: Right Arm, Cuff Size: Normal)   Pulse 92   Temp 98.2 F (36.8 C) (Oral)   Resp 16   Ht 5\' 6"  (1.676 m)   Wt 169 lb 6.4 oz (76.8 kg)   SpO2 98%   BMI 27.34 kg/m       Objective:   Physical Exam  Constitutional: She appears well-developed and well-nourished.  HENT:  Head: Normocephalic and atraumatic.  Right Ear: Tympanic membrane and ear canal normal.  Left Ear: Tympanic membrane  and ear canal normal.  Mouth/Throat: No oropharyngeal exudate, posterior oropharyngeal edema or posterior oropharyngeal erythema.  Cardiovascular: Normal rate, regular rhythm and normal heart sounds.  No murmur heard. Pulmonary/Chest: Effort normal and breath sounds normal. No respiratory distress. She has no wheezes.  Psychiatric: She has a normal mood and affect. Her behavior is normal. Judgment and thought content normal.  Assessment & Plan:  Viral URI with cough-chest x-ray is performed and is negative for pneumonia.  Symptoms most consistent with viral etiology at this time.  We discussed supportive measures including Mucinex as needed for chest congestion and Tessalon as needed for cough suppression.  She is advised to call if new or worsening symptoms or if her symptoms are not improved in 3-4 days.

## 2017-09-13 NOTE — Patient Instructions (Signed)
Please complete chest x ray on the first floor. You may use mucinex as needed for chest congestion. You may use tessalon as needed to control your cough. Call if new/worsening symptoms or if not improved in 3-4 days.

## 2017-10-24 ENCOUNTER — Telehealth: Payer: Self-pay | Admitting: *Deleted

## 2017-10-24 NOTE — Telephone Encounter (Signed)
Received Provider Query from [facility] for Medical Record Clarification on Depression for Coding purposes, OV note attached; forwarded to provider/SLS 01/07

## 2017-10-31 ENCOUNTER — Other Ambulatory Visit: Payer: Self-pay | Admitting: Family

## 2017-12-05 ENCOUNTER — Telehealth: Payer: Self-pay | Admitting: *Deleted

## 2017-12-05 NOTE — Telephone Encounter (Signed)
Left detailed message on pt's voicemail that she may drop forms off in person to our front office or she may fax them to [phone] to my attention and to call and let me know how she wants to deliver the forms. Awaiting call from pt.  Copied from [place] [phone]. Topic: Inquiry >> Dec 05, 2017 10:11 AM Neva Seat wrote: Pt is going into a [place] in [place].  Pt needs to close on the transfer this week.  They informed her she will need forms filled out by her PCP to help with the closing.  Pt needs someone to call her back to advise her the way she can get the forms to Dr. Inda Castle to be completed for this week.

## 2017-12-05 NOTE — Telephone Encounter (Signed)
Received forms via fax and placed copy of med list and and snap shot (demographics) to forms and placed in PCP red folder. Pt is requesting to have forms completed by Tuesday or Wednesday. Left detailed message on home # that I cannot guarantee form completion by tomorrow but will notify her ASAP when forms are completed.

## 2017-12-06 NOTE — Telephone Encounter (Signed)
Form, demographics and med list faxed to [facility] at [phone]. Pt notified and wanted to thank PCP for all her care.

## 2017-12-06 NOTE — Telephone Encounter (Signed)
Form completed.

## 2017-12-10 ENCOUNTER — Other Ambulatory Visit: Payer: Self-pay | Admitting: Family

## 2017-12-15 ENCOUNTER — Other Ambulatory Visit: Payer: Self-pay | Admitting: Family

## 2017-12-30 ENCOUNTER — Telehealth: Payer: Self-pay | Admitting: Family

## 2017-12-30 ENCOUNTER — Other Ambulatory Visit: Payer: Self-pay | Admitting: Family

## 2017-12-30 MED ORDER — MIRABEGRON ER 25 MG PO TB24: 25.0000 mg | ORAL_TABLET | Freq: Every day | ORAL | 5 refills | Status: DC

## 2017-12-30 MED ORDER — LEVOTHYROXINE SODIUM 50 MCG PO TABS: 50.0000 ug | ORAL_TABLET | Freq: Every day | ORAL | 1 refills | Status: DC

## 2017-12-30 MED ORDER — ESCITALOPRAM OXALATE 10 MG PO TABS: 10.0000 mg | ORAL_TABLET | Freq: Every day | ORAL | 1 refills | Status: DC

## 2017-12-30 NOTE — Telephone Encounter (Signed)
Copied from [place] [phone]. Topic: Quick Communication - Rx Refill/Question >> Dec 30, 2017 10:28 AM Percell Belt A wrote: Medication: ***  Has the patient contacted their pharmacy?   (Agent: If no, request that the patient contact the pharmacy for the refill.)  Preferred Pharmacy (with phone number or street name): ***  Agent: Please be advised that RX refills may take up to 3 business days. We ask that you follow-up with your pharmacy.

## 2017-12-30 NOTE — Telephone Encounter (Signed)
Received fax from pharmacy requesting refill of Myrbetriq. Pt is relocating to [place]. Spoke with pharmacist to see if request came from pt or is automated. She states pt just called them this morning requesting status of refill. Also requesting refills of: levothyroxine and lexapro. Refills sent.

## 2018-01-09 ENCOUNTER — Telehealth: Payer: Self-pay | Admitting: Family

## 2018-01-09 ENCOUNTER — Other Ambulatory Visit: Payer: Self-pay | Admitting: Family

## 2018-01-09 DIAGNOSIS — Z1231 Encounter for screening mammogram for malignant neoplasm of breast: Secondary | ICD-10-CM

## 2018-01-09 NOTE — Telephone Encounter (Signed)
Left detailed message for pt to call if she still needed this. Spoke with pharmacist and she states that Rx had already been picked so issue must have been resolved.

## 2018-01-09 NOTE — Telephone Encounter (Signed)
Please contact pt and let her know that I received a noted from her [facility] pharmacy requesting change to Mylan brand levothyroxine in place of Sandoz brand.  Does she still need this?  If so, ok to send rx for Mylan brand to her local pharmacy.

## 2018-01-12 ENCOUNTER — Ambulatory Visit (HOSPITAL_BASED_OUTPATIENT_CLINIC_OR_DEPARTMENT_OTHER)
Admission: RE | Admit: 2018-01-12 | Discharge: 2018-01-12 | Disposition: A | Discharge status: Home / Self Care | Payer: Medicare Other | Source: Ambulatory Visit | Attending: Family | Admitting: Family

## 2018-01-12 DIAGNOSIS — Z1231 Encounter for screening mammogram for malignant neoplasm of breast: Secondary | ICD-10-CM

## 2018-03-06 ENCOUNTER — Ambulatory Visit: Payer: Medicare Other | Admitting: Family

## 2018-03-06 ENCOUNTER — Ambulatory Visit: Payer: Medicare Other | Admitting: *Deleted

## 2018-04-27 ENCOUNTER — Other Ambulatory Visit: Payer: Self-pay | Admitting: Family

## 2018-05-25 ENCOUNTER — Telehealth: Payer: Self-pay | Admitting: Family

## 2018-05-25 MED ORDER — HYDROCHLOROTHIAZIDE 12.5 MG PO CAPS: ORAL_CAPSULE | ORAL | 0 refills | Status: AC

## 2018-05-25 NOTE — Telephone Encounter (Signed)
Copied from [place] [phone]. Topic: Quick Communication - Rx Refill/Question >> May 25, 2018  2:53 PM Mallory Hardy wrote: Medication: hydrochlorothiazide (MICROZIDE) 12.5 MG capsule. Pt called stating that she has enough medication to last until Sunday 05/28/18. Pt requested a refill to be sent to pharmacy in [place]. Pt states that the pharmacy has been requesting refill for weeks. Pt is requesting 90 day supply.   Has the patient contacted their pharmacy? yes  Preferred Pharmacy (with phone number or street name): [facility] [place], [place] [phone] (Phone) [phone] (Fax)

## 2018-06-28 ENCOUNTER — Telehealth: Payer: Self-pay | Admitting: *Deleted

## 2018-06-28 MED ORDER — ESCITALOPRAM OXALATE 10 MG PO TABS: 10.0000 mg | ORAL_TABLET | Freq: Every day | ORAL | 0 refills | Status: DC

## 2018-06-28 MED ORDER — LEVOTHYROXINE SODIUM 50 MCG PO TABS: 50.0000 ug | ORAL_TABLET | Freq: Every day | ORAL | 0 refills | Status: DC

## 2018-06-28 NOTE — Telephone Encounter (Signed)
Received requests from [facility] in [place] for lexapro and levothyroxine. Refill sent.

## 2018-06-30 NOTE — Progress Notes (Deleted)
Subjective:   Mallory Hardy is a 83 y.o. female who presents for Medicare Annual (Subsequent) preventive examination.  Review of Systems: No ROS.  Medicare Wellness Visit. Additional risk factors are reflected in the social history.   Sleep patterns: Home Safety/Smoke Alarms: Feels safe in home. Smoke alarms in place.     Female:         Mammo-utd       Dexa scan-utd            Objective:     Vitals: There were no vitals taken for this visit.  There is no height or weight on file to calculate BMI.  Advanced Directives 07/27/2017 03/04/2017 09/02/2015 07/04/2015 06/26/2015  Does Patient Have a Medical Advance Directive? No Yes Yes Yes Yes  Type of Advance Directive - [place];Living will Living will [place];Living will Living will;Healthcare Power of Attorney  Does patient want to make changes to medical advance directive? - - No - Patient declined - -  Copy of [place] in Chart? - No - copy requested Yes No - copy requested -    Tobacco Social History   Tobacco Use  Smoking Status Never Smoker  Smokeless Tobacco Never Used     Counseling given: Not Answered   Clinical Intake:                       Past Medical History:  Diagnosis Date  . Anxiety   . Arthritis    SHOULDERS  . Complication of anesthesia    SLOW TO WAKE  . Depression   . Diverticulosis   . Gross hematuria   . Hiatal hernia   . History of esophageal dilatation    FOR STRICTURE  . History of kidney stones   . History of squamous cell carcinoma excision   . Hypertension   . Hypothyroidism, postsurgical   . Lesion of bladder   . Lower urinary tract symptoms (LUTS)   . Migraine headache   . Mild acid reflux    no meds//  but made changes with diet and eating times  . Nephrolithiasis    RIGHT SIDE  . PONV (postoperative nausea and vomiting)   . Right ureteral stone   . Seasonal allergic rhinitis   . SUI (stress  urinary incontinence, female)   . Urethral caruncle   . Wears partial dentures    lower   Past Surgical History:  Procedure Laterality Date  . ABDOMINAL EXPLORATION SURGERY  1959   r/o endometriosis  . ANTERIOR AND POSTERIOR REPAIR  06-03-2003  ("bladder tack")   AND CYSTO/  EXCISION URETHRAL CARUNCLE   . CATARACT EXTRACTION W/ INTRAOCULAR LENS  IMPLANT, BILATERAL    . CYSTO/  PERIURETHERAL EXCISION AND BX  11-25-2009  . CYSTOSCOPY W/ URETERAL STENT PLACEMENT Right 09/02/2015   Procedure: RIGHT URETEROSCOPY WITH RETROGRADE PYELOGRAM/ AND URETERAL STENT PLACEMENT;  Surgeon: Nickie Retort, MD;  Location: [facility];  Service: Urology;  Laterality: Right;  . CYSTOSCOPY WITH BIOPSY N/A 09/02/2015   Procedure: CYSTOSCOPY WITH BIOPSY;  Surgeon: Nickie Retort, MD;  Location: [facility];  Service: Urology;  Laterality: N/A;  . EXPLORATORY LAPAROTOMY W/ BILATERAL SALPINGOOPHORECTOMY  08-21-2002  . EXTRACORPOREAL SHOCK WAVE LITHOTRIPSY  x5  1990--2010  . HOLMIUM LASER APPLICATION Right 01/75/1025   Procedure: LASER LITHOTRIPSY RIGHT;  Surgeon: Nickie Retort, MD;  Location: [facility];  Service: Urology;  Laterality: Right;  . MULTIPLE VAGINAL LESION BX'S  08-31-2001  . SHOULDER ARTHROSCOPY WITH OPEN ROTATOR CUFF REPAIR AND DISTAL CLAVICLE ACROMINECTOMY Right 06-14-2006  . THYROIDECTOMY  1959   goiter  . VAGINAL HYSTERECTOMY  1978   Family History  Problem Relation Age of Onset  . Atrial fibrillation Sister   . Hypertension Mother   . Heart disease Sister   . Cancer Neg Hx   . Diabetes Neg Hx   . Heart attack Neg Hx   . Hyperlipidemia Neg Hx   . Sudden death Neg Hx   . Colon cancer Neg Hx    Social History   Socioeconomic History  . Marital status: Widowed    Spouse name: Not on file  . Number of children: 2  . Years of education: Not on file  . Highest education level: Not on file  Occupational History  . Occupation:  Retired    Fish farm manager: RETIRED  Social Needs  . Financial resource strain: Not on file  . Food insecurity:    Worry: Not on file    Inability: Not on file  . Transportation needs:    Medical: Not on file    Non-medical: Not on file  Tobacco Use  . Smoking status: Never Smoker  . Smokeless tobacco: Never Used  Substance and Sexual Activity  . Alcohol use: No    Alcohol/week: 0.0 standard drinks  . Drug use: No  . Sexual activity: Never  Lifestyle  . Physical activity:    Days per week: Not on file    Minutes per session: Not on file  . Stress: Not on file  Relationships  . Social connections:    Talks on phone: Not on file    Gets together: Not on file    Attends religious service: Not on file    Active member of club or organization: Not on file    Attends meetings of clubs or organizations: Not on file    Relationship status: Not on file  Other Topics Concern  . Not on file  Social History Narrative   Lives with son and his wife.      Outpatient Encounter Medications as of 07/03/2018  Medication Sig  . acetaminophen (TYLENOL) 650 MG CR tablet Take by mouth. 1-2 tablets daily PRN for pain  . aspirin 81 MG tablet Take 81 mg by mouth 3 (three) times a week.   . benzonatate (TESSALON) 100 MG capsule Take 1 capsule (100 mg total) by mouth 3 (three) times daily as needed.  . Biotin 5000 MCG CAPS Take 2 capsules by mouth every evening.  . Calcium Carbonate Antacid (TUMS E-X PO) Take 2 each by mouth at bedtime.  . Calcium Carbonate-Vitamin D (CALTRATE 600+D) 600-400 MG-UNIT per tablet Take 1 tablet by mouth daily. (Patient taking differently: Take 1 tablet by mouth every morning. )  . Cholecalciferol (VITAMIN D3) 5000 UNITS CAPS Take 2 capsules by mouth every evening.  . diclofenac sodium (VOLTAREN) 1 % GEL Apply 2 g topically 3 (three) times daily as needed (for neck/shoulder pain.). Reported on 04/22/2016  . docusate sodium (COLACE) 100 MG capsule Take 1 capsule (100 mg total) by  mouth 2 (two) times daily.  Marland Kitchen escitalopram (LEXAPRO) 10 MG tablet Take 1 tablet (10 mg total) by mouth daily.  . hydrochlorothiazide (MICROZIDE) 12.5 MG capsule TAKE ONE CAPSULE BY MOUTH ONCE DAILY IN THE MORNING  . levothyroxine (SYNTHROID, LEVOTHROID) 50 MCG tablet Take 1 tablet (50 mcg total) by mouth daily.  Marland Kitchen  lisinopril (PRINIVIL,ZESTRIL) 2.5 MG tablet TAKE 1 TABLET BY MOUTH ONCE DAILY IN THE MORNING  . mirabegron ER (MYRBETRIQ) 25 MG TB24 tablet Take 1 tablet (25 mg total) by mouth daily.  . Multiple Vitamins-Minerals (MULTIVITAMIN WITH MINERALS) tablet Take 1 tablet by mouth every morning.   Marland Kitchen omeprazole (PRILOSEC) 20 MG capsule Take 1 capsule (20 mg total) daily by mouth.  Marland Kitchen omeprazole (PRILOSEC) 20 MG capsule TAKE 1 CAPSULE BY MOUTH ONCE DAILY   No facility-administered encounter medications on file as of 07/03/2018.     Activities of Daily Living No flowsheet data found.  Patient Care Team: Debbrah Alar, NP as PCP - General (Internal Medicine) Calvert Cantor, MD as Consulting Physician (Ophthalmology) Nickie Retort, MD as Consulting Physician (Urology)    Assessment:   This is a routine wellness examination for [facility]. Physical assessment deferred to PCP.  Exercise Activities and Dietary recommendations   Diet (meal preparation, eat out, water intake, caffeinated beverages, dairy products, fruits and vegetables): {Desc; diets:16563} Breakfast: Lunch:  Dinner:      Goals    . Weight (lb) < 170 lb (77.1 kg)       Fall Risk Fall Risk  07/12/2017 03/04/2017 10/20/2016 10/01/2015 08/07/2014  Falls in the past year? No No No No No    Depression Screen PHQ 2/9 Scores 07/12/2017 03/04/2017 10/20/2016 10/01/2015  PHQ - 2 Score 3 0 0 0  PHQ- 9 Score 7 - - -     Cognitive Function MMSE - Mini Mental State Exam 03/04/2017  Orientation to time 5  Orientation to Place 5  Registration 3  Attention/ Calculation 5  Recall 2  Language- name 2 objects 2  Language-  repeat 1  Language- follow 3 step command 3  Language- read & follow direction 1  Write a sentence 1  Copy design 1  Total score 29        Immunization History  Administered Date(s) Administered  . Influenza Split 07/07/2011, 08/17/2012  . Influenza Whole 07/28/2009, 07/16/2010  . Influenza, High Dose Seasonal PF 08/24/2016, 06/30/2017  . Influenza,inj,Quad PF,6+ Mos 06/27/2013, 08/07/2014  . Influenza-Unspecified 07/19/2015  . Pneumococcal Conjugate-13 05/01/2014  . Pneumococcal Polysaccharide-23 08/11/2006  . Tdap 07/11/2013  . Zoster 08/17/2012   Screening Tests Health Maintenance  Topic Date Due  . INFLUENZA VACCINE  05/18/2018  . MAMMOGRAM  01/13/2019  . TETANUS/TDAP  07/12/2023  . DEXA SCAN  Completed  . PNA vac Low Risk Adult  Completed      Plan:   ***   I have personally reviewed and noted the following in the patient's chart:   . Medical and social history . Use of alcohol, tobacco or illicit drugs  . Current medications and supplements . Functional ability and status . Nutritional status . Physical activity . Advanced directives . List of other physicians . Hospitalizations, surgeries, and ER visits in previous 12 months . Vitals . Screenings to include cognitive, depression, and falls . Referrals and appointments  In addition, I have reviewed and discussed with patient certain preventive protocols, quality metrics, and best practice recommendations. A written personalized care plan for preventive services as well as general preventive health recommendations were provided to patient.     Shela Nevin, [place]  06/30/2018

## 2018-07-03 ENCOUNTER — Ambulatory Visit: Payer: Medicare Other | Admitting: *Deleted

## 2018-07-03 ENCOUNTER — Ambulatory Visit: Payer: Medicare Other | Admitting: Family

## 2018-08-24 ENCOUNTER — Other Ambulatory Visit: Payer: Self-pay | Admitting: Family

## 2018-10-23 ENCOUNTER — Other Ambulatory Visit: Payer: Self-pay | Admitting: Family

## 2018-10-26 ENCOUNTER — Other Ambulatory Visit: Payer: Self-pay | Admitting: Family

## 2019-01-28 ENCOUNTER — Other Ambulatory Visit: Payer: Self-pay | Admitting: Family

## 2019-01-29 ENCOUNTER — Telehealth: Payer: Self-pay | Admitting: Family

## 2019-01-29 NOTE — Telephone Encounter (Signed)
Please contact pt and let her know that I sent a 30 day rx to her pharmacy however she is past due for follow up. Please see if she would like to schedule a virtual visit on Wednesday 4/15.

## 2019-01-29 NOTE — Telephone Encounter (Signed)
Lm for patient to call back and schedule virtual visi

## 2019-01-30 NOTE — Telephone Encounter (Signed)
Per patient she is living in [place] now and has a pcp there. Pharmacy contacted [place] by mistake. She will follow up with pcp there from now on.

## 2019-01-30 NOTE — Telephone Encounter (Signed)
fyi
